# Patient Record
Sex: Female | Born: 1970 | Race: Asian | Hispanic: No | Marital: Married | Smoking: Never smoker
Health system: Southern US, Community
[De-identification: ages and names within clinical notes are randomized; demographics above are authoritative.]

## PROBLEM LIST (undated history)

## (undated) DIAGNOSIS — J45909 Unspecified asthma, uncomplicated: Secondary | ICD-10-CM

## (undated) DIAGNOSIS — E785 Hyperlipidemia, unspecified: Secondary | ICD-10-CM

## (undated) DIAGNOSIS — K219 Gastro-esophageal reflux disease without esophagitis: Secondary | ICD-10-CM

## (undated) HISTORY — DX: Gastro-esophageal reflux disease without esophagitis: K21.9

## (undated) HISTORY — DX: Unspecified asthma, uncomplicated: J45.909

## (undated) HISTORY — DX: Hyperlipidemia, unspecified: E78.5

---

## 2016-08-28 ENCOUNTER — Ambulatory Visit (INDEPENDENT_AMBULATORY_CARE_PROVIDER_SITE_OTHER): Payer: BC Managed Care – PPO | Admitting: Family Medicine

## 2016-08-28 ENCOUNTER — Encounter: Payer: Self-pay | Admitting: Family Medicine

## 2016-08-28 VITALS — BP 114/68 | HR 72 | Temp 98.5°F | Ht 62.0 in | Wt 100.2 lb

## 2016-08-28 DIAGNOSIS — Z0000 Encounter for general adult medical examination without abnormal findings: Secondary | ICD-10-CM

## 2016-08-28 DIAGNOSIS — R079 Chest pain, unspecified: Secondary | ICD-10-CM

## 2016-08-28 NOTE — Progress Notes (Signed)
Pre visit review using our clinic review tool, if applicable. No additional management support is needed unless otherwise documented below in the visit note. 

## 2016-08-28 NOTE — Progress Notes (Signed)
Subjective:    Patient ID: Tonya Higgins, female    DOB: 03/09/1971, 46 y.o.   MRN: [id number]  HPI This is a 46 yo female, accompanied by her husband, who presents today to establish care and discuss chest pain that started today. She woke up at 1:45 in the morning with feeling of chest tightness and pain. Tightness is not constant, pain constant, sometimes feels more with breathing but not with other activities, felt like heart was racing last night. Describes pain as "icky." Currently having pain 6/10. No diaphoresis. Some pain in left shoulder, not sure if this is related to recent yard work. Feels a little short of breath. No family history of heart disease. No previous hospitalizations except for childbirth, no regular medication.   She has two children, 5 and 76 yo. 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 She has had some difficult students lately. Very busy with her job. Husband would like her to exercise more.   Sees gyn (can't remember name). Last pap 03/2016.   No past medical history on file. Past Surgical History:  Procedure Laterality Date  . CESAREAN SECTION     Family History  Problem Relation Age of Onset  . Diabetes Father    Social History  Substance Use Topics  . Smoking status: Never Smoker  . Smokeless tobacco: Never Used  . Alcohol use No     Review of Systems  Constitutional: Positive for fatigue (didn't sleep well last night).  Respiratory: Positive for chest tightness and shortness of breath.   Cardiovascular: Positive for chest pain and palpitations.  Gastrointestinal: Negative for nausea.      Objective:   Physical Exam  Constitutional: She is oriented to person, place, and time. She appears well-developed and well-nourished. No distress.  HENT:  Head: Atraumatic.  Eyes: Conjunctivae are normal.  Neck: Normal range of motion. Neck supple.  Cardiovascular: Normal rate, regular rhythm, normal heart sounds and  intact distal pulses.  Exam reveals no gallop and no friction rub.   No murmur heard. Pulmonary/Chest: Effort normal and breath sounds normal. No respiratory distress. She has no wheezes. She has no rales. She exhibits no tenderness.  Conversing in full symptoms without difficulty, ambulating without difficulty. SpO2 100 %.  Musculoskeletal: Normal range of motion. She exhibits tenderness (left trapezius, left scapula. ). She exhibits no edema.  Neurological: She is alert and oriented to person, place, and time.  Skin: Skin is warm and dry. She is not diaphoretic.  Psychiatric: She has a normal mood and affect. Her behavior is normal. Judgment and thought content normal.  Vitals reviewed.     BP 114/68 (BP Location: Right Arm, Patient Position: Sitting, Cuff Size: Normal)   Pulse 72   Temp 98.5 F (36.9 C) (Oral)   Ht  (1.575 m)   Wt 100 lb 3.2 oz (45.5 kg)   LMP 08/10/2016   SpO2 100%   BMI 18.33 kg/m   EKG- rate 72, negative precordial T waves, otherwise no abnormalities    Assessment & Plan:  Discussed with Dr. Berline Chough who reviewed EKG and agreed with plan  1. Chest pain, unspecified type - does not seem cardiac in nature, discussed EKG and plan with patient and her husband who verbalized understanding - ibuprofen 400 mg po q 8-12 hours - EKG 12-Lead - ER if worsening pain or if occurs on exertion - follow up if no improvement in 48 hours   2. Encounter  for medical examination to establish care - follow up PRN   Olean Ree, FNP-BC  [facility], [facility]  08/28/2016 11:25 AM

## 2016-08-28 NOTE — Patient Instructions (Signed)
Your ekg looks good I think your pain is from your muscles, please try to take some ibuprofen, 2 tablets every 8-12 hours  If your pain gets worse over the weekend, please go to the emergency room If your pain seems to come with exerting yourself, will consider referring you to a cardiologist.

## 2017-03-02 ENCOUNTER — Ambulatory Visit: Payer: BC Managed Care – PPO | Admitting: Family Medicine

## 2017-05-03 ENCOUNTER — Emergency Department (HOSPITAL_COMMUNITY)
Admission: EM | Admit: 2017-05-03 | Discharge: 2017-05-04 | Disposition: A | Discharge status: Home / Self Care | Payer: BC Managed Care – PPO | Attending: Emergency Medicine | Admitting: Emergency Medicine

## 2017-05-03 ENCOUNTER — Encounter (HOSPITAL_COMMUNITY): Payer: Self-pay | Admitting: Emergency Medicine

## 2017-05-03 ENCOUNTER — Other Ambulatory Visit: Payer: Self-pay

## 2017-05-03 DIAGNOSIS — J9801 Acute bronchospasm: Secondary | ICD-10-CM | POA: Diagnosis not present

## 2017-05-03 DIAGNOSIS — R0789 Other chest pain: Secondary | ICD-10-CM

## 2017-05-03 MED ORDER — ALBUTEROL SULFATE (2.5 MG/3ML) 0.083% IN NEBU
5.0000 mg | INHALATION_SOLUTION | Freq: Once | RESPIRATORY_TRACT | Status: DC
  Filled 2017-05-03: qty 6

## 2017-05-03 NOTE — ED Triage Notes (Signed)
Patient is complaining of left chest pain underneath her breast. Patient is also stating that it is hard to breathe. Patient states this started yesterday. 

## 2017-05-04 ENCOUNTER — Emergency Department (HOSPITAL_COMMUNITY): Payer: BC Managed Care – PPO

## 2017-05-04 LAB — COMPREHENSIVE METABOLIC PANEL
ALBUMIN: 4.7 g/dL (ref 3.5–5.0)
ALT: 19 U/L (ref 14–54)
ANION GAP: 5 (ref 5–15)
AST: 18 U/L (ref 15–41)
Alkaline Phosphatase: 65 U/L (ref 38–126)
BILIRUBIN TOTAL: 0.2 mg/dL — AB (ref 0.3–1.2)
BUN: 21 mg/dL — ABNORMAL HIGH (ref 6–20)
CHLORIDE: 104 mmol/L (ref 101–111)
CO2: 26 mmol/L (ref 22–32)
Calcium: 9.5 mg/dL (ref 8.9–10.3)
Creatinine, Ser: 0.82 mg/dL (ref 0.44–1.00)
GFR calc Af Amer: >60 mL/min (ref >60)
GFR calc non Af Amer: >60 mL/min (ref >60)
GLUCOSE: 115 mg/dL — AB (ref 65–99)
POTASSIUM: 3.5 mmol/L (ref 3.5–5.1)
Sodium: 135 mmol/L (ref 135–145)
TOTAL PROTEIN: 7.7 g/dL (ref 6.5–8.1)

## 2017-05-04 LAB — CBC WITH DIFFERENTIAL/PLATELET
BASOS ABS: 0 10*3/uL (ref 0.0–0.1)
Basophils Relative: 0 %
Eosinophils Absolute: 0.1 10*3/uL (ref 0.0–0.7)
Eosinophils Relative: 1 %
HEMATOCRIT: 41.3 % (ref 36.0–46.0)
Hemoglobin: 13.5 g/dL (ref 12.0–15.0)
LYMPHS ABS: 1.9 10*3/uL (ref 0.7–4.0)
LYMPHS PCT: 19 %
MCH: 26.5 pg (ref 26.0–34.0)
MCHC: 32.7 g/dL (ref 30.0–36.0)
MCV: 81 fL (ref 78.0–100.0)
MONO ABS: 0.5 10*3/uL (ref 0.1–1.0)
Monocytes Relative: 5 %
NEUTROS ABS: 7.7 10*3/uL (ref 1.7–7.7)
Neutrophils Relative %: 75 %
Platelets: 310 10*3/uL (ref 150–400)
RBC: 5.1 MIL/uL (ref 3.87–5.11)
RDW: 12.3 % (ref 11.5–15.5)
WBC: 10.2 10*3/uL (ref 4.0–10.5)

## 2017-05-04 LAB — I-STAT TROPONIN, ED: Troponin i, poc: 0 ng/mL (ref 0.00–0.08)

## 2017-05-04 LAB — I-STAT BETA HCG BLOOD, ED (MC, WL, AP ONLY)

## 2017-05-04 MED ORDER — PREDNISONE 20 MG PO TABS
60.0000 mg | ORAL_TABLET | Freq: Once | ORAL | Status: DC
  Filled 2017-05-04: qty 3

## 2017-05-04 MED ORDER — IPRATROPIUM-ALBUTEROL 0.5-2.5 (3) MG/3ML IN SOLN
3.0000 mL | Freq: Once | RESPIRATORY_TRACT | Status: AC
  Administered 2017-05-04: 3 mL via RESPIRATORY_TRACT
  Filled 2017-05-04: qty 3

## 2017-05-04 MED ORDER — ALBUTEROL SULFATE HFA 108 (90 BASE) MCG/ACT IN AERS: 2.0000 | INHALATION_SPRAY | RESPIRATORY_TRACT | 0 refills | Status: DC | PRN

## 2017-05-04 MED ORDER — PREDNISONE 50 MG PO TABS: 50.0000 mg | ORAL_TABLET | Freq: Every day | ORAL | 0 refills | Status: DC

## 2017-05-04 NOTE — Discharge Instructions (Signed)
Use the inhaler as needed for the chest tightness. Return if symptoms are getting worse.

## 2017-05-04 NOTE — ED Provider Notes (Signed)
[facility]-EMERGENCY DEPT Provider Note   CSN: [id number] Arrival date & time: 05/03/17  2330     History   Chief Complaint Chief Complaint  Patient presents with  . Shortness of Breath  . Chest Pain    HPI Tonya Higgins is a 46 y.o. female.  The history is provided by the patient.  She has no significant past medical history.  She had onset yesterday of a heavy feeling in the left lateral chest with some radiation to the left shoulder and upper arm.  This comes and goes.  There is associated dyspnea but no nausea or diaphoresis.  She denies any cough.  Symptoms are worse when she is supine, but also worse when she is outside in cold air.  There is no exertional component.  She rates her discomfort at 3/10.  She did have similar episode 8 months ago.  Of note, her father is in [place] because of a heart attack.  She has no history of diabetes, hypertension, hyperlipidemia.  She is a non-smoker.  There is no family history of premature coronary atherosclerosis.  History reviewed. No pertinent past medical history.  There are no active problems to display for this patient.   Past Surgical History:  Procedure Laterality Date  . CESAREAN SECTION      OB History    No data available       Home Medications    Prior to Admission medications   Not on File    Family History Family History  Problem Relation Age of Onset  . Diabetes Father     Social History Social History   Tobacco Use  . Smoking status: Never Smoker  . Smokeless tobacco: Never Used  Substance Use Topics  . Alcohol use: No  . Drug use: No     Allergies   Patient has no known allergies.   Review of Systems Review of Systems  All other systems reviewed and are negative.    Physical Exam Updated Vital Signs BP 125/83 (BP Location: Left Arm)   Pulse 74   Temp 98.2 F (36.8 C) (Oral)   Resp 18   LMP 04/07/2017   SpO2 99%   Physical Exam    Nursing note and vitals reviewed.  46 year old female, resting comfortably and in no acute distress. Vital signs are normal. Oxygen saturation is 99%, which is normal. Head is normocephalic and atraumatic. PERRLA, EOMI. Oropharynx is clear. Neck is nontender and supple without adenopathy or JVD. Back is nontender and there is no CVA tenderness. Lungs are clear without rales, wheezes, or rhonchi.  Slight prolongation of exhalation phase is noted. Chest is mildly tender diffusely. Heart has regular rate and rhythm without murmur. Abdomen is soft, flat, nontender without masses or hepatosplenomegaly and peristalsis is normoactive. Extremities have no cyanosis or edema, full range of motion is present. Skin is warm and dry without rash. Neurologic: Mental status is normal, cranial nerves are intact, there are no motor or sensory deficits.   ED Treatments / Results  Labs (all labs ordered are listed, but only abnormal results are displayed) Labs Reviewed  COMPREHENSIVE METABOLIC PANEL - Abnormal; Notable for the following components:      Result Value   Glucose, Bld 115 (*)    BUN 21 (*)    Total Bilirubin 0.2 (*)    All other components within normal limits  CBC WITH DIFFERENTIAL/PLATELET  I-STAT TROPONIN, ED  I-STAT BETA HCG BLOOD,  ED [facility], [facility], [facility] ONLY)    EKG  EKG Interpretation  Date/Time:  Monday May 03 2017 23:43:00 EST Ventricular Rate:  72 PR Interval:    QRS Duration: 74 QT Interval:  385 QTC Calculation: 422 R Axis:   82 Text Interpretation:  Sinus rhythm Normal ECG No old tracing to compare Confirmed by Dione BoozeGlick, Tierra Divelbiss ([id number]) on 05/03/2017 11:48:41 PM       Radiology Dg Chest 2 View  Result Date: 05/04/2017 CLINICAL DATA:  Acute onset of left chest pain. Difficulty breathing. EXAM: CHEST  2 VIEW COMPARISON:  None. FINDINGS: The lungs are well-aerated and clear. There is no evidence of focal opacification, pleural effusion or pneumothorax. The heart is normal  in size; the mediastinal contour is within normal limits. No acute osseous abnormalities are seen. IMPRESSION: No acute cardiopulmonary process seen. Electronically Signed   By: Roanna RaiderJeffery  Chang M.D.   On: 05/04/2017 00:28    Procedures Procedures (including critical care time)  Medications Ordered in ED Medications  albuterol (PROVENTIL) (2.5 MG/3ML) 0.083% nebulizer solution 5 mg (not administered)     Initial Impression / Assessment and Plan / ED Course  I have reviewed the triage vital signs and the nursing notes.  Pertinent labs & imaging results that were available during my care of the patient were reviewed by me and considered in my medical decision making (see chart for details).  Chest pain which is somewhat atypical.  She has no cardiac risk factors and ECG is normal.  Heart score is 0, which puts her at very low risk of major adverse cardiac events in the next 30 days.  I suspect that some of her symptoms may be related to concern about her father's illness.  Because of prolonged exhalation phase, she is given a therapeutic trial of albuterol with ipratropium.  Old records are reviewed confirming office evaluation for similar chest discomfort in April of this year.  Laboratory workup and chest x-ray are unremarkable.  She got considerable symptomatic relief with nebulizer treatment.  She is given a dose of prednisone and discharged with prescriptions for prednisone and albuterol inhaler.  Follow-up with PCP as needed.  Return precautions discussed.  Final Clinical Impressions(s) / ED Diagnoses   Final diagnoses:  Atypical chest pain  Bronchospasm    ED Discharge Orders        Ordered    albuterol (PROVENTIL HFA;VENTOLIN HFA) 108 (90 Base) MCG/ACT inhaler  Every 4 hours PRN     05/04/17 0133    predniSONE (DELTASONE) 50 MG tablet  Daily     05/04/17 0133       Dione BoozeGlick, Areyana Leoni, MD 05/04/17 [phone]

## 2017-05-06 ENCOUNTER — Emergency Department (HOSPITAL_COMMUNITY)
Admission: EM | Admit: 2017-05-06 | Discharge: 2017-05-06 | Disposition: A | Discharge status: Home / Self Care | Payer: BC Managed Care – PPO | Attending: Emergency Medicine | Admitting: Emergency Medicine

## 2017-05-06 ENCOUNTER — Ambulatory Visit: Payer: BC Managed Care – PPO | Admitting: Family Medicine

## 2017-05-06 ENCOUNTER — Other Ambulatory Visit: Payer: Self-pay

## 2017-05-06 ENCOUNTER — Ambulatory Visit: Payer: Self-pay

## 2017-05-06 ENCOUNTER — Encounter (HOSPITAL_COMMUNITY): Payer: Self-pay

## 2017-05-06 DIAGNOSIS — R0602 Shortness of breath: Secondary | ICD-10-CM | POA: Insufficient documentation

## 2017-05-06 DIAGNOSIS — R079 Chest pain, unspecified: Secondary | ICD-10-CM | POA: Insufficient documentation

## 2017-05-06 DIAGNOSIS — Z79899 Other long term (current) drug therapy: Secondary | ICD-10-CM | POA: Insufficient documentation

## 2017-05-06 DIAGNOSIS — R200 Anesthesia of skin: Secondary | ICD-10-CM | POA: Diagnosis not present

## 2017-05-06 LAB — COMPREHENSIVE METABOLIC PANEL
ALK PHOS: 58 U/L (ref 38–126)
ALT: 17 U/L (ref 14–54)
ANION GAP: 7 (ref 5–15)
AST: 16 U/L (ref 15–41)
Albumin: 5 g/dL (ref 3.5–5.0)
BILIRUBIN TOTAL: 0.6 mg/dL (ref 0.3–1.2)
BUN: 19 mg/dL (ref 6–20)
CALCIUM: 10 mg/dL (ref 8.9–10.3)
CO2: 28 mmol/L (ref 22–32)
Chloride: 105 mmol/L (ref 101–111)
Creatinine, Ser: 0.8 mg/dL (ref 0.44–1.00)
GLUCOSE: 105 mg/dL — AB (ref 65–99)
Potassium: 4.1 mmol/L (ref 3.5–5.1)
Sodium: 140 mmol/L (ref 135–145)
TOTAL PROTEIN: 8.4 g/dL — AB (ref 6.5–8.1)

## 2017-05-06 LAB — CBC
HEMATOCRIT: 44 % (ref 36.0–46.0)
HEMOGLOBIN: 14.4 g/dL (ref 12.0–15.0)
MCH: 26.8 pg (ref 26.0–34.0)
MCHC: 32.7 g/dL (ref 30.0–36.0)
MCV: 81.8 fL (ref 78.0–100.0)
Platelets: 312 10*3/uL (ref 150–400)
RBC: 5.38 MIL/uL — ABNORMAL HIGH (ref 3.87–5.11)
RDW: 12.4 % (ref 11.5–15.5)
WBC: 7.6 10*3/uL (ref 4.0–10.5)

## 2017-05-06 LAB — TROPONIN I

## 2017-05-06 LAB — I-STAT TROPONIN, ED: TROPONIN I, POC: 0 ng/mL (ref 0.00–0.08)

## 2017-05-06 LAB — D-DIMER, QUANTITATIVE (NOT AT ARMC)

## 2017-05-06 MED ORDER — ALUM & MAG HYDROXIDE-SIMETH 200-200-20 MG/5ML PO SUSP
30.0000 mL | Freq: Once | ORAL | Status: AC
  Administered 2017-05-06: 30 mL via ORAL
  Filled 2017-05-06: qty 30

## 2017-05-06 MED ORDER — OMEPRAZOLE 20 MG PO CPDR: 20.0000 mg | DELAYED_RELEASE_CAPSULE | Freq: Every day | ORAL | 0 refills | Status: DC

## 2017-05-06 NOTE — ED Provider Notes (Signed)
[facility]-EMERGENCY DEPT Provider Note   CSN: [id number] Arrival date & time: 05/06/17  0915     History   Chief Complaint Chief Complaint  Patient presents with  . Chest Pain  . Numbness  . Shortness of Breath    HPI Tonya Higgins is a 46 y.o. female.  HPI Patient is a 46 year old female with no prior cardiac disease who presents the emergency department with chest pain shortness of breath which has been persistent since last night with tightness in her chest and radiation towards her left shoulder.  This is very similar to her episode of discomfort for which she was seen in the emergency department 2 days ago.  At that time she had a workup including chest x-ray, labs, troponin, EKG which demonstrated no evidence of ischemia.  She was discharged home.  She states the only time she had chest discomfort like this prior to the last several days was in the spring 2018.  She was seen by her primary care physician at that time.  She had no recurrent chest pain or arm pain throughout the summer.  She works as a Airline pilotprofessor at [employer]  She is active walking on campus and upstairs but does not routinely exercise.  No prior history of cardiac disease.  No family history of early cardiac disease.  No family history of venous thromboembolic disease.  No history of diabetes, hypertension or hyperlipidemia.  She does not smoke tobacco products.  Denies unilateral leg swelling.  No recent travel or surgery.     History reviewed. No pertinent past medical history.  There are no active problems to display for this patient.   Past Surgical History:  Procedure Laterality Date  . CESAREAN SECTION      OB History    No data available       Home Medications    Prior to Admission medications   Medication Sig Start Date End Date Taking? Authorizing Provider  acetaminophen (TYLENOL) 500 MG tablet Take 500 mg by mouth every 6 (six) hours as needed for mild pain,  moderate pain, fever or headache.   Yes [provider]  albuterol (PROVENTIL HFA;VENTOLIN HFA) 108 (90 Base) MCG/ACT inhaler Inhale 2 puffs into the lungs every 4 (four) hours as needed for wheezing or shortness of breath (or coughing). 05/04/17  Yes Dione BoozeGlick, David, MD  Multiple Vitamins-Minerals (WOMENS ONE DAILY) TABS Take 1 tablet by mouth daily.   Yes [provider]  omeprazole (PRILOSEC) 20 MG capsule Take 1 capsule (20 mg total) by mouth daily. 05/06/17   Azalia Bilisampos, Philippe Gang, MD    Family History Family History  Problem Relation Age of Onset  . Diabetes Father     Social History Social History   Tobacco Use  . Smoking status: Never Smoker  . Smokeless tobacco: Never Used  Substance Use Topics  . Alcohol use: No  . Drug use: No     Allergies   Patient has no known allergies.   Review of Systems Review of Systems  All other systems reviewed and are negative.    Physical Exam Updated Vital Signs BP 104/67 (BP Location: Left Arm)   Pulse 71   Temp 98.4 F (36.9 C) (Oral)   Resp 16   Ht 5\' 2"  (1.575 m)   Wt 45.4 kg (100 lb)   LMP 04/07/2017   SpO2 100%   BMI 18.29 kg/m   Physical Exam  Constitutional: She is oriented to person, place, and time.  She appears well-developed and well-nourished. No distress.  HENT:  Head: Normocephalic and atraumatic.  Eyes: EOM are normal.  Neck: Normal range of motion.  Cardiovascular: Normal rate, regular rhythm and normal heart sounds.  Pulmonary/Chest: Effort normal and breath sounds normal.  Abdominal: Soft. She exhibits no distension. There is no tenderness.  Musculoskeletal: Normal range of motion.  Neurological: She is alert and oriented to person, place, and time.  Skin: Skin is warm and dry.  Psychiatric: She has a normal mood and affect. Judgment normal.  Nursing note and vitals reviewed.    ED Treatments / Results  Labs (all labs ordered are listed, but only abnormal results are displayed) Labs  Reviewed  CBC - Abnormal; Notable for the following components:      Result Value   RBC 5.38 (*)    All other components within normal limits  COMPREHENSIVE METABOLIC PANEL - Abnormal; Notable for the following components:   Glucose, Bld 105 (*)    Total Protein 8.4 (*)    All other components within normal limits  TROPONIN I  D-DIMER, QUANTITATIVE (NOT AT [facility])  I-STAT TROPONIN, ED    EKG  EKG Interpretation  Date/Time:  Thursday May 06 2017 09:34:42 EST Ventricular Rate:  78 PR Interval:    QRS Duration: 81 QT Interval:  369 QTC Calculation: 421 R Axis:   78 Text Interpretation:  Sinus rhythm No significant change was found Confirmed by Azalia Bilisampos, Jaray Boliver ([id number]) on 05/06/2017 10:24:42 AM       Radiology No results found.  Procedures Procedures (including critical care time)  Medications Ordered in ED Medications  alum & mag hydroxide-simeth (MAALOX/MYLANTA) 200-200-20 MG/5ML suspension 30 mL (30 mLs Oral Given 05/06/17 1047)     Initial Impression / Assessment and Plan / ED Course  I have reviewed the triage vital signs and the nursing notes.  Pertinent labs & imaging results that were available during my care of the patient were reviewed by me and considered in my medical decision making (see chart for details).     Chest x-ray from 2 days ago reviewed and without abnormality.  D-dimer negative.  Troponin negative x2.  EKG without changes.  No signs of ischemia.  Low risk as she has no cardiac risk factors.  Feels better after Maalox.  Likely gastroesophageal reflux disease.  I will have her follow-up with cardiology.  She may benefit from outpatient stress testing but my suspicion is low.  I do not think she needs acute hospitalization at this time.  Discharged home in good condition.  Home with Prilosec.  She understands to return to the ER for new or worsening symptoms  Final Clinical Impressions(s) / ED Diagnoses   Final diagnoses:  Chest pain, unspecified  type    ED Discharge Orders        Ordered    omeprazole (PRILOSEC) 20 MG capsule  Daily     05/06/17 1513       Azalia Bilisampos, Sameeha Rockefeller, MD 05/06/17 1534

## 2017-05-06 NOTE — Telephone Encounter (Signed)
See note

## 2017-05-06 NOTE — Telephone Encounter (Signed)
Pt. calls today for follow up. Seen in ED 05/03/17 with chest pain. Diagnosed with atypical chest pain and bronchospasm. States inhaler has helped. Coughing some at night. Is lethargic.  Reason for Disposition . [1] Chest pain(s) lasting a few seconds AND [2] persists > 3 days  Answer Assessment - Initial Assessment Questions 1. LOCATION: "Where does it hurt?"       No pain 2. RADIATION: "Does the pain go anywhere else?" (e.g., into neck, jaw, arms, back)     No 3. ONSET: "When did the chest pain begin?" (Minutes, hours or days)       No pain today 4. PATTERN "Does the pain come and go, or has it been constant since it started?"  "Does it get worse with exertion?"      Comes and and goes. 5. DURATION: "How long does it last" (e.g., seconds, minutes, hours)     N/A 6. SEVERITY: "How bad is the pain?"  (e.g., Scale 1-10; mild, moderate, or severe)    - MILD (1-3): doesn't interfere with normal activities     - MODERATE (4-7): interferes with normal activities or awakens from sleep    - SEVERE (8-10): excruciating pain, unable to do any normal activities       n/A 7. CARDIAC RISK FACTORS: "Do you have any history of heart problems or risk factors for heart disease?" (e.g., prior heart attack, angina; high blood pressure, diabetes, being overweight, high cholesterol, smoking, or strong family history of heart disease)     No 8. PULMONARY RISK FACTORS: "Do you have any history of lung disease?"  (e.g., blood clots in lung, asthma, emphysema, birth control pills)     No 9. CAUSE: "What do you think is causing the chest pain?"     Asthma 10. OTHER SYMPTOMS: "Do you have any other symptoms?" (e.g., dizziness, nausea, vomiting, sweating, fever, difficulty breathing, cough)       No 11. PREGNANCY: "Is there any chance you are pregnant?" "When was your last menstrual period?"       No  Protocols used: CHEST PAIN-A-AH

## 2017-05-06 NOTE — ED Triage Notes (Signed)
Patient states she was seen a few days afor chest pain and SOB, but now the symptoms are worse and still occurring. SOB and chest pain worse when walking

## 2017-05-06 NOTE — Telephone Encounter (Signed)
This patient is currently in the ED

## 2017-05-10 ENCOUNTER — Encounter: Payer: Self-pay | Admitting: Family Medicine

## 2017-05-10 ENCOUNTER — Telehealth: Payer: Self-pay | Admitting: Family Medicine

## 2017-05-10 ENCOUNTER — Ambulatory Visit: Payer: BC Managed Care – PPO | Admitting: Family Medicine

## 2017-05-10 DIAGNOSIS — R0602 Shortness of breath: Secondary | ICD-10-CM | POA: Insufficient documentation

## 2017-05-10 MED ORDER — FLUTICASONE PROPIONATE HFA 44 MCG/ACT IN AERO: 1.0000 | INHALATION_SPRAY | Freq: Two times a day (BID) | RESPIRATORY_TRACT | 12 refills | Status: DC

## 2017-05-10 NOTE — Telephone Encounter (Signed)
Left message to return call to our office.  CRM Started ok to give message.   

## 2017-05-10 NOTE — Assessment & Plan Note (Addendum)
Given that she has some reactive component to her symptoms, as well as improvement with albuterol and Flovent, she very likely has some underlying pulmonary disease.  Today, her respiratory exam is stable and she is satting at 99% on room air.  She underwent cardiac workup recently in the emergency room including EKG and multiple troponins all of which were negative.  Her chest x-ray was negative.  She has no risk factors for cardiac disease.  Her PERC is negative.  She has no signs of heart failure.  We will empirically start albuterol and Flovent for the time being.  We will refer her for PFTs to confirm her diagnosis and rule out other pulmonary etiologies.  Return precautions reviewed.  Patient will follow-up in 1-3 months.

## 2017-05-10 NOTE — Progress Notes (Signed)
    Subjective:  Tonya Higgins is a 46 y.o. female who presents today with a chief complaint of shortness of breath.   HPI: Summary of recent ED visits: Patient presented to the ED on 05/03/2017 with chest pain and shortness of breath.  She had negative cardiac workup at that time however was empirically given albuterol with ipratropium which helped with her symptoms.  She was prescribed prednisone and an albuterol inhaler and discharged home.  Patient again return to the ED on 05/06/2017 with chest pain.  She again had a negative cardiac workup.  She was diagnosed with reflux and started on Prilosec and discharged home.  Shortness of breath, new issue Several year history, however symptoms significantly worsened over the last couple of weeks.  Reports that she was diagnosed with asthma as a child but was never treated for this.  Symptoms are worse with exertion and with exposure to cold air.  Both her children have been diagnosed with asthma.  She has tried to their albuterol and Flovent inhalers which have seemed to help with her symptoms.  No chest pain currently.  No cough or wheeze.  No other notable alleviating factors noted.  ROS: Per HPI, otherwise 10 point review of systems was negative. Pertinent PMSFH: Never smoker.  Both of her children have asthma.  Objective:  Physical Exam: BP 114/64 (BP Location: Left Arm, Patient Position: Sitting, Cuff Size: Normal)   Pulse 89   Temp 97.9 F (36.6 C) (Oral)   Ht 5\' 2"  (1.575 m)   Wt 99 lb 6.4 oz (45.1 kg)   SpO2 99%   BMI 18.18 kg/m   Gen: NAD, resting comfortably CV: RRR with no murmurs appreciated Pulm: NWOB, CTAB with no crackles, wheezes, or rhonchi  Review/summary of ED workup: Chest x-ray 05/04/2017: Negative. EKG 05/07/2017: Normal sinus rhythm.  No ischemic changes.  Assessment/Plan:  No problem-specific Assessment & Plan notes found for this encounter.  Patient has a high level of medical decision making due to  number of diagnoses and amount/complexity of data reviewed.  Katina Degreealeb M. Jimmey RalphParker, MD 05/10/2017 9:33 AM

## 2017-05-10 NOTE — Telephone Encounter (Signed)
Patient wants to follow up with Dr Earlene PlaterWallace after her hospital visit. She has has a cardiac work up and she has been cleared, She is sure that her chest tightness is asthma related and she has used her son's inhaler and it has helped her. She wants to come in to see Dr Earlene PlaterWallace to discuss starting Flovent/Albuterol. She does not want to wait until next week. Can she be worked in sooner? Please let her know.

## 2017-05-10 NOTE — Telephone Encounter (Signed)
See note

## 2017-05-10 NOTE — Telephone Encounter (Signed)
Thanks Dr. Earlene PlaterWallace. No, I clarified that with her; she wants to see you as her family all sees you.

## 2017-05-10 NOTE — Telephone Encounter (Signed)
Okay transfer. Note: She did see Jimmey Ralpharker today - does she want to see him? EW

## 2017-05-10 NOTE — Telephone Encounter (Signed)
The patient would like to transfer care from Deboraha Sprangebbie Gessner to Dr. Earlene PlaterWallace. Please advise. The patient would like to be notified via phone call when a decision has been made.

## 2017-05-11 DIAGNOSIS — Z87898 Personal history of other specified conditions: Secondary | ICD-10-CM

## 2017-05-11 HISTORY — DX: Personal history of other specified conditions: Z87.898

## 2017-05-12 NOTE — Telephone Encounter (Signed)
Was seen in office on 12/31

## 2017-05-14 ENCOUNTER — Telehealth: Payer: Self-pay | Admitting: Family Medicine

## 2017-05-14 NOTE — Telephone Encounter (Signed)
Thanks for following up. Not acceptable to be transferred this way. Should get an okay from previous PCP. In this case, since I agreed to take her and because I am seeing her other family members. I am including Tonya Higgins and Lea. Please find out what happened and follow up with me re: this issue.

## 2017-05-14 NOTE — Telephone Encounter (Signed)
Dear Dr. Earlene PlaterWallace, earlier this week I sent a telephone note to you and Deboraha SprangDebbie Gessner because the patient [facility]. I see your answer, however I just learned that she's on vacation. I went to call the patient to update them, however the patient has a new patient appointment at a different [place] office. Would you like me to leave it be, or reach out to her? Please advise.

## 2017-05-14 NOTE — Telephone Encounter (Signed)
I was notified by the [facility] during a previous call this week with a separate question that Eunice BlaseDebbie was on vacation and remembered the verbal encounter when this was brought to my attention this morning. Unfortunately, Eunice BlaseDebbie was unable to respond in time before the patient chose another location. No further action needed by our office due to patient already transferring to another office.

## 2017-05-14 NOTE — Telephone Encounter (Signed)
I spoke with the patient and she was upset because she had requested Dr. Earlene PlaterWallace to be her PCP from the beginning. I have scheduled the patient to see Dr. Earlene PlaterWallace on 05/19/17 at 3 PM and made Dr. Earlene PlaterWallace her PCP in [place].

## 2017-05-14 NOTE — Telephone Encounter (Signed)
Please call the patient to see if she would like to transfer or [facility]

## 2017-05-15 NOTE — Telephone Encounter (Signed)
FYI

## 2017-05-17 ENCOUNTER — Encounter: Payer: Self-pay | Admitting: Family Medicine

## 2017-05-17 ENCOUNTER — Ambulatory Visit (INDEPENDENT_AMBULATORY_CARE_PROVIDER_SITE_OTHER): Payer: BC Managed Care – PPO | Admitting: Family Medicine

## 2017-05-17 VITALS — BP 110/64 | HR 79 | Temp 97.6°F | Wt 99.8 lb

## 2017-05-17 DIAGNOSIS — N644 Mastodynia: Secondary | ICD-10-CM | POA: Diagnosis not present

## 2017-05-17 DIAGNOSIS — N912 Amenorrhea, unspecified: Secondary | ICD-10-CM | POA: Diagnosis not present

## 2017-05-17 DIAGNOSIS — R079 Chest pain, unspecified: Secondary | ICD-10-CM

## 2017-05-17 DIAGNOSIS — R0789 Other chest pain: Secondary | ICD-10-CM

## 2017-05-17 LAB — COMPREHENSIVE METABOLIC PANEL
ALT: 26 U/L (ref 0–35)
AST: 18 U/L (ref 0–37)
Albumin: 4.8 g/dL (ref 3.5–5.2)
Alkaline Phosphatase: 56 U/L (ref 39–117)
BUN: 12 mg/dL (ref 6–23)
CO2: 29 mEq/L (ref 19–32)
Calcium: 10.1 mg/dL (ref 8.4–10.5)
Chloride: 103 mEq/L (ref 96–112)
Creatinine, Ser: 0.71 mg/dL (ref 0.40–1.20)
GFR: 93.96 mL/min (ref >60.00)
Glucose, Bld: 104 mg/dL — ABNORMAL HIGH (ref 70–99)
Potassium: 4.7 mEq/L (ref 3.5–5.1)
Sodium: 139 mEq/L (ref 135–145)
Total Bilirubin: 0.5 mg/dL (ref 0.2–1.2)
Total Protein: 7.2 g/dL (ref 6.0–8.3)

## 2017-05-17 LAB — CBC WITH DIFFERENTIAL/PLATELET
Basophils Absolute: 0.1 10*3/uL (ref 0.0–0.1)
Basophils Relative: 0.5 % (ref 0.0–3.0)
Eosinophils Absolute: 0.1 10*3/uL (ref 0.0–0.7)
Eosinophils Relative: 0.7 % (ref 0.0–5.0)
HCT: 42.8 % (ref 36.0–46.0)
Hemoglobin: 13.7 g/dL (ref 12.0–15.0)
Lymphocytes Relative: 22.9 % (ref 12.0–46.0)
Lymphs Abs: 2.3 10*3/uL (ref 0.7–4.0)
MCHC: 32 g/dL (ref 30.0–36.0)
MCV: 82.1 fl (ref 78.0–100.0)
Monocytes Absolute: 0.6 10*3/uL (ref 0.1–1.0)
Monocytes Relative: 6.4 % (ref 3.0–12.0)
Neutro Abs: 7 10*3/uL (ref 1.4–7.7)
Neutrophils Relative %: 69.5 % (ref 43.0–77.0)
Platelets: 302 10*3/uL (ref 150.0–400.0)
RBC: 5.21 Mil/uL — ABNORMAL HIGH (ref 3.87–5.11)
RDW: 12.6 % (ref 11.5–15.5)
WBC: 10.1 10*3/uL (ref 4.0–10.5)

## 2017-05-17 LAB — LUTEINIZING HORMONE: LH: 24.69 m[IU]/mL

## 2017-05-17 LAB — POCT URINE PREGNANCY: Preg Test, Ur: NEGATIVE

## 2017-05-17 LAB — C-REACTIVE PROTEIN: CRP: 0.1 mg/dL — ABNORMAL LOW (ref 0.5–20.0)

## 2017-05-17 LAB — FOLLICLE STIMULATING HORMONE: FSH: 16.1 m[IU]/mL

## 2017-05-17 LAB — SEDIMENTATION RATE: Sed Rate: 6 mm/hr (ref 0–20)

## 2017-05-17 LAB — H. PYLORI ANTIBODY, IGG: H Pylori IgG: NEGATIVE

## 2017-05-17 MED ORDER — BUDESONIDE-FORMOTEROL FUMARATE 160-4.5 MCG/ACT IN AERO: 2.0000 | INHALATION_SPRAY | Freq: Two times a day (BID) | RESPIRATORY_TRACT | 3 refills | Status: DC

## 2017-05-17 MED ORDER — MONTELUKAST SODIUM 10 MG PO TABS: 10.0000 mg | ORAL_TABLET | Freq: Every day | ORAL | 3 refills | Status: DC

## 2017-05-17 MED ORDER — IBUPROFEN-FAMOTIDINE 800-26.6 MG PO TABS: 1.0000 | ORAL_TABLET | Freq: Two times a day (BID) | ORAL | 0 refills | Status: DC

## 2017-05-17 NOTE — Progress Notes (Signed)
Tonya Higgins is a 47 y.o. female [facility]   Patient Care Team: Helane RimaWallace, Gae Bihl, DO as PCP - General (Family Medicine)   History of Present Illness:  Tonya Higgins, CMA acting as scribe for Dr. Helane RimaErica Baylen Dea.   Chest Pain   This is a new problem. The current episode started 1 to 4 weeks ago. The onset quality is gradual. The problem occurs daily. The problem has been unchanged. The pain is at a severity of 4/10. The quality of the pain is described as crushing and heavy. The pain radiates to the right shoulder. Associated symptoms include dizziness, nausea and shortness of breath. Pertinent negatives include no fever. She has tried antacids for the symptoms. The treatment provided mild relief.   The patient is a professor at [employer], works in lab. Endorses high-stress, but states that it is not new.   Health Maintenance Due  Topic Date Due  . HIV Screening  10/25/1985  . TETANUS/TDAP  10/25/1989  . PAP SMEAR  10/26/1991   Depression screen PHQ 2/9 05/17/2017  Decreased Interest 0  Down, Depressed, Hopeless 0  PHQ - 2 Score 0   PMHx, SurgHx, SocialHx, Medications, and Allergies were reviewed in the Visit Navigator and updated as appropriate.   History reviewed. No pertinent past medical history.  Past Surgical History:  Procedure Laterality Date  . CESAREAN SECTION      Family History  Problem Relation Age of Onset  . Diabetes Father   . Asthma Child   . Breast cancer Neg Hx    Social History   Tobacco Use  . Smoking status: Never Smoker  . Smokeless tobacco: Never Used  Substance Use Topics  . Alcohol use: No  . Drug use: No   Current Medications and Allergies:   .  albuterol (PROVENTIL HFA;VENTOLIN HFA) 108 (90 Base) MCG/ACT inhaler, Inhale 2 puffs into the lungs every 4 (four) hours as needed for wheezing or shortness of breath (or coughing)., Disp: 1 Inhaler, Rfl: 0 .  fexofenadine (ALLEGRA) 30 MG/5ML suspension, Take 30 mg by mouth daily.,  Disp: , Rfl:  .  fluticasone (FLOVENT HFA) 44 MCG/ACT inhaler, Inhale 1 puff into the lungs 2 (two) times daily., Disp: 1 Inhaler, Rfl: 12 .  Multiple Vitamins-Minerals (WOMENS ONE DAILY) TABS, Take 1 tablet by mouth daily., Disp: , Rfl:   No Known Allergies   Review of Systems:   Pertinent items are noted in the HPI. Otherwise, ROS is negative.  Vitals:   Vitals:   05/17/17 1241  BP: 110/64  Pulse: 79  Temp: 97.6 F (36.4 C)  TempSrc: Oral  SpO2: 96%  Weight: 99 lb 12.8 oz (45.3 kg)     Body mass index is 18.25 kg/m.  Physical Exam:   Physical Exam  Constitutional: She is oriented to person, place, and time. She appears well-developed and well-nourished. No distress.  HENT:  Head: Normocephalic and atraumatic.  Right Ear: External ear normal.  Left Ear: External ear normal.  Nose: Nose normal.  Mouth/Throat: Oropharynx is clear and moist.  Eyes: Conjunctivae and EOM are normal. Pupils are equal, round, and reactive to light.  Neck: Normal range of motion. Neck supple. No thyromegaly present.  Cardiovascular: Normal rate, regular rhythm, normal heart sounds and intact distal pulses.  Pulmonary/Chest: Effort normal and breath sounds normal.  Abdominal: Soft. Bowel sounds are normal.  Musculoskeletal: Normal range of motion.  Lymphadenopathy:    She has no cervical adenopathy.  Neurological: She is  alert and oriented to person, place, and time.  Skin: Skin is warm and dry. Capillary refill takes less than 2 seconds.  Psychiatric: She has a normal mood and affect. Her behavior is normal.  Nursing note and vitals reviewed.  Results for orders placed or performed in visit on 05/17/17  H. pylori antibody, IgG  Result Value Ref Range   H Pylori IgG Negative Negative  CBC with Differential/Platelet  Result Value Ref Range   WBC 10.1 4.0 - 10.5 K/uL   RBC 5.21 (H) 3.87 - 5.11 Mil/uL   Hemoglobin 13.7 12.0 - 15.0 g/dL   HCT 16.1 09.6 - 04.5 %   MCV 82.1 78.0 - 100.0 fl    MCHC 32.0 30.0 - 36.0 g/dL   RDW 40.9 81.1 - 91.4 %   Platelets 302.0 150.0 - 400.0 K/uL   Neutrophils Relative % 69.5 43.0 - 77.0 %   Lymphocytes Relative 22.9 12.0 - 46.0 %   Monocytes Relative 6.4 3.0 - 12.0 %   Eosinophils Relative 0.7 0.0 - 5.0 %   Basophils Relative 0.5 0.0 - 3.0 %   Neutro Abs 7.0 1.4 - 7.7 K/uL   Lymphs Abs 2.3 0.7 - 4.0 K/uL   Monocytes Absolute 0.6 0.1 - 1.0 K/uL   Eosinophils Absolute 0.1 0.0 - 0.7 K/uL   Basophils Absolute 0.1 0.0 - 0.1 K/uL  Comprehensive metabolic panel  Result Value Ref Range   Sodium 139 135 - 145 mEq/L   Potassium 4.7 3.5 - 5.1 mEq/L   Chloride 103 96 - 112 mEq/L   CO2 29 19 - 32 mEq/L   Glucose, Bld 104 (H) 70 - 99 mg/dL   BUN 12 6 - 23 mg/dL   Creatinine, Ser 7.82 0.40 - 1.20 mg/dL   Total Bilirubin 0.5 0.2 - 1.2 mg/dL   Alkaline Phosphatase 56 39 - 117 U/L   AST 18 0 - 37 U/L   ALT 26 0 - 35 U/L   Total Protein 7.2 6.0 - 8.3 g/dL   Albumin 4.8 3.5 - 5.2 g/dL   Calcium 95.6 8.4 - 21.3 mg/dL   GFR 08.65 >78.46 mL/min  C-reactive protein  Result Value Ref Range   CRP 0.1 (L) 0.5 - 20.0 mg/dL  Sedimentation rate  Result Value Ref Range   Sed Rate 6 0 - 20 mm/hr  Follicle stimulating hormone  Result Value Ref Range   FSH 16.1 mIU/ML  Luteinizing hormone  Result Value Ref Range   LH 24.69 mIU/mL  POCT urine pregnancy  Result Value Ref Range   Preg Test, Ur Negative Negative   EKG: unchanged from previous tracings, normal sinus rhythm.  Assessment and Plan:   1. Atypical chest pain No red flags today. EKG WNL, previous CXR WNL. She has already been referred to Pulmonology for PFTs. She has also started using albuterol prn with mild relief of symptoms. Famotidine recommended at last visit with mild improvement in symptoms. She does not have tenderness along the costochondral junction, but reports sleeping in a fetal position. The family just got a new puppy and she is worried that the new exposure may be causing an  allergic reaction, resulting in a RAD. Labs, CXR, EKG, and exam are all reassuring. Will add Singulair to current allergy regimen. Will give sample of Symbicort to see if inhaled corticosteroid is helpful. Red flags reviewed.   - EKG 12-Lead - montelukast (SINGULAIR) 10 MG tablet; Take 1 tablet (10 mg total) by mouth at bedtime.  Dispense:  30 tablet; Refill: 3 - H. pylori antibody, IgG - ECHOCARDIOGRAM COMPLETE; Future - CBC with Differential/Platelet - Comprehensive metabolic panel - C-reactive protein - Sedimentation rate  2. Amenorrhea - POCT urine pregnancy - Follicle stimulating hormone - Luteinizing hormone  3. Breast pain Mild tenderness to palpation at left breast, 3 o'clock, near midaxillary line. No skin changes or palpable nodules. She is due for a mammogram, so will go ahead and order.   - MM DIAG BREAST TOMO BILATERAL; Future   . Reviewed expectations re: course of current medical issues. . Discussed self-management of symptoms. . Outlined signs and symptoms indicating need for more acute intervention. . Patient verbalized understanding and all questions were answered. Marland Kitchen Health Maintenance issues including appropriate healthy diet, exercise, and smoking avoidance were discussed with patient. . See orders for this visit as documented in the electronic medical record. . Patient received an After Visit Summary.  CMA served as Neurosurgeon during this visit. History, Physical, and Plan performed by medical provider. The above documentation has been reviewed and is accurate and complete. Helane Rima, D.O.   Helane Rima, DO [place] 05/22/2017

## 2017-05-19 ENCOUNTER — Ambulatory Visit: Payer: BC Managed Care – PPO | Admitting: Family Medicine

## 2017-05-20 ENCOUNTER — Ambulatory Visit: Payer: BC Managed Care – PPO | Admitting: Internal Medicine

## 2017-05-20 ENCOUNTER — Other Ambulatory Visit (INDEPENDENT_AMBULATORY_CARE_PROVIDER_SITE_OTHER): Payer: BC Managed Care – PPO

## 2017-05-20 ENCOUNTER — Encounter: Payer: Self-pay | Admitting: Internal Medicine

## 2017-05-20 ENCOUNTER — Other Ambulatory Visit: Payer: Self-pay

## 2017-05-20 ENCOUNTER — Ambulatory Visit (HOSPITAL_COMMUNITY): Payer: BC Managed Care – PPO | Attending: Cardiology

## 2017-05-20 VITALS — BP 104/60 | HR 77 | Ht 62.0 in | Wt 100.0 lb

## 2017-05-20 DIAGNOSIS — R42 Dizziness and giddiness: Secondary | ICD-10-CM | POA: Diagnosis not present

## 2017-05-20 DIAGNOSIS — J4520 Mild intermittent asthma, uncomplicated: Secondary | ICD-10-CM

## 2017-05-20 DIAGNOSIS — R079 Chest pain, unspecified: Secondary | ICD-10-CM | POA: Diagnosis not present

## 2017-05-20 DIAGNOSIS — R110 Nausea: Secondary | ICD-10-CM | POA: Insufficient documentation

## 2017-05-20 LAB — CBC WITH DIFFERENTIAL/PLATELET
Basophils Absolute: 0.1 10*3/uL (ref 0.0–0.1)
Basophils Relative: 1.1 % (ref 0.0–3.0)
Eosinophils Absolute: 0.2 10*3/uL (ref 0.0–0.7)
Eosinophils Relative: 1.3 % (ref 0.0–5.0)
HCT: 39.4 % (ref 36.0–46.0)
HEMOGLOBIN: 12.6 g/dL (ref 12.0–15.0)
LYMPHS PCT: 20.8 % (ref 12.0–46.0)
Lymphs Abs: 2.3 10*3/uL (ref 0.7–4.0)
MCHC: 31.9 g/dL (ref 30.0–36.0)
MCV: 81.4 fl (ref 78.0–100.0)
MONO ABS: 0.7 10*3/uL (ref 0.1–1.0)
MONOS PCT: 6.1 % (ref 3.0–12.0)
Neutro Abs: 7.9 10*3/uL — ABNORMAL HIGH (ref 1.4–7.7)
Neutrophils Relative %: 70.7 % (ref 43.0–77.0)
Platelets: 288 10*3/uL (ref 150.0–400.0)
RBC: 4.84 Mil/uL (ref 3.87–5.11)
RDW: 12.9 % (ref 11.5–15.5)
WBC: 11.1 10*3/uL — AB (ref 4.0–10.5)

## 2017-05-20 MED ORDER — BUDESONIDE-FORMOTEROL FUMARATE 80-4.5 MCG/ACT IN AERO: 2.0000 | INHALATION_SPRAY | Freq: Two times a day (BID) | RESPIRATORY_TRACT | 11 refills | Status: DC

## 2017-05-20 MED ORDER — PANTOPRAZOLE SODIUM 40 MG PO TBEC
40.0000 mg | DELAYED_RELEASE_TABLET | Freq: Every day | ORAL | 2 refills | Status: DC
Start: 2017-05-20 — End: 2017-06-08

## 2017-05-20 MED ORDER — FAMOTIDINE 20 MG PO TABS: ORAL_TABLET | ORAL | 2 refills | Status: DC

## 2017-05-20 NOTE — Patient Instructions (Addendum)
Plan A = Automatic = symbicort 80 Take 2 puffs first thing in am and then another 2 puffs about 12 hours later and continue singulair 10 mg each am  And Pantoprazole (protonix) 40 mg   Take  30-60 min before first meal of the day and Pepcid (famotidine)  20 mg one @  bedtime until return to office - this is the best way to tell whether stomach acid is contributing to your problem.    Plan B = Backup Only use your albuterol as a rescue medication to be used if you can't catch your breath by resting or doing a relaxed purse lip breathing pattern.  - The less you use it, the better it will work when you need it. - Ok to use the inhaler up to 2 puffs  every 4 hours if you must but call for appointment if use goes up over your usual need - Don't leave home without it !!  (think of it like the spare tire for your car)    GERD (REFLUX)  is an extremely common cause of respiratory symptoms just like yours , many times with no obvious heartburn at all.    It can be treated with medication, but also with lifestyle changes including elevation of the head of your bed (ideally with 6 inch  bed blocks),  Smoking cessation, avoidance of late meals, excessive alcohol, and avoid fatty foods, chocolate, peppermint, colas, red wine, and acidic juices such as orange juice.  NO MINT OR MENTHOL PRODUCTS SO NO COUGH DROPS   USE SUGARLESS CANDY INSTEAD (Jolley ranchers or Stover's or Life Savers) or even ice chips will also do - the key is to swallow to prevent all throat clearing. NO OIL BASED VITAMINS - use powdered substitutes.    Please remember to go to the lab department downstairs in the basement  for your tests - we will call you with the results when they are available.  Please schedule a follow up office visit in 4 weeks, sooner if needed with pfts on return and all inhalers / meds in hand to sort out what you need long term to control this problem which is likely asthma

## 2017-05-20 NOTE — Addendum Note (Signed)
Addended by: Christen ButterASKIN, Arrow Tomko M on: 05/20/2017 03:29 PM   Modules accepted: Orders

## 2017-05-20 NOTE — Progress Notes (Signed)
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 Pulmonary Consult    Referred by Dr. Jimmey Ralph. Pt c/o SOB since 05/03/17. She states she feels SOB in the early am and then at night. She also has had some chest tightness.    new onset chest tightness April 2018 p working in the yard woke up wit it next day  And resolved s rx  New puppy afternoon of Dec 23'd = chizu then night of Dec 24th late pm chest tightness / sob s cough > er inhalers and pred and 40% better > back to ER 12/27 with neg enzymes/ neg d dimer > prilosec x 7 days no change then  Found son's inhaler worked better than anything= albuterol / flovent and started on singulair 05/17/17 and symbicort 160 and 100% better x for occ hb.   No obvious day to day or daytime variability or assoc excess/ purulent sputum or mucus plugs or hemoptysis or    subjective wheeze or overt sinus  symptoms. No unusual exposure hx (has no international travel in 10 years) or h/o childhood pna/ asthma or knowledge of premature birth.  Sleeping ok flat without nocturnal  or early am exacerbation  of respiratory  c/o's or need for noct saba. Also denies any obvious fluctuation of symptoms with weather or environmental changes or other aggravating or alleviating factors except as outlined above   Current Allergies, Complete Past Medical History, Past Surgical History, Family History, and Social History were reviewed in [employer] record.  ROS  The following are not active complaints unless bolded Hoarseness, sore throat, dysphagia, dental problems, itching, sneezing,  nasal congestion or discharge of excess mucus or  purulent secretions, ear ache,   fever, chills, sweats, unintended wt loss or wt gain, classically pleuritic or exertional cp,  orthopnea pnd or leg swelling, presyncope, palpitations, abdominal pain, anorexia, nausea, vomiting, diarrhea  or change in bowel habits or change in bladder habits, change in stools or change in urine, dysuria, hematuria,  rash, arthralgias, visual complaints, headache, numbness, weakness or ataxia or problems with walking or coordination,  change in mood/affect or memory.        Current Meds  Medication Sig  . albuterol (PROVENTIL HFA;VENTOLIN HFA) 108 (90 Base) MCG/ACT inhaler Inhale 2 puffs into the lungs every 4 (four) hours as needed for wheezing or shortness of breath (or coughing).  . montelukast (SINGULAIR) 10 MG tablet Take 1 tablet (10 mg total) by mouth at bedtime.  . Multiple Vitamins-Minerals (WOMENS ONE DAILY) TABS Take 1 tablet by mouth daily.  . [DISCONTINUED] budesonide-formoterol (SYMBICORT) 160-4.5 MCG/ACT inhaler Inhale 2 puffs into the lungs 2 (two) times daily.  .  S              Review of Systems     Objective:   Physical Exam     Amb [place] female nad  Wt Readings from Last 3 Encounters:  05/20/17 100 lb (45.4 kg)  05/17/17 99 lb 12.8 oz (45.3 kg)  05/10/17 99 lb 6.4 oz (45.1 kg)     Vital signs reviewed - Note on arrival  02 sats  100% on RA      HEENT: nl dentition, turbinates bilaterally, and oropharynx. Nl external ear canals without cough reflex   NECK :  without JVD/Nodes/TM/ nl carotid upstrokes bilaterally   LUNGS: no acc muscle use,  Nl contour chest which is clear to A and P bilaterally without cough on insp or exp maneuvers   CV:  RRR  no s3 or murmur or increase in P2, and no edema   ABD:  soft and nontender with nl inspiratory excursion in the supine position. No bruits or organomegaly appreciated, bowel sounds nl  MS:  Nl gait/ ext warm without deformities, calf tenderness, cyanosis or clubbing No  obvious joint restrictions   SKIN: warm and dry without lesions    NEURO:  alert, approp, nl sensorium with  no motor or cerebellar deficits apparent.       I personally reviewed images and agree with radiology impression as follows:  CXR:   05/04/17 No acute cardiopulmonary process seen.  Assessment:

## 2017-05-20 NOTE — Addendum Note (Signed)
Addended by: Christen ButterASKIN, Tracey Hermance M on: 05/20/2017 03:13 PM   Modules accepted: Orders

## 2017-05-20 NOTE — Assessment & Plan Note (Signed)
Spirometry 05/20/2017  Not physiologic  - Allergy profile 05/20/2017 >  Eos 0. /  IgE   - 05/20/2017  After extensive coaching inhaler device  effectiveness =    50% try symb 80 2bid    Symptoms are markedly disproportionate to objective findings and not clear this is actually much of a  lung problem but pt does appear to have difficult to sort out respiratory symptoms of unknown origin for which  DDX  = almost all start with A and  include Adherence, Ace Inhibitors, Acid Reflux, Active Sinus Disease, Alpha 1 Antitripsin deficiency, Anxiety masquerading as Airways dz,  ABPA,  Allergy(esp in young), Aspiration (esp in elderly), Adverse effects of meds,  Active smokers, A bunch of PE's (a small clot burden can't cause this syndrome unless there is already severe underlying pulm or vascular dz with poor reserve) plus two Bs  = Bronchiectasis and Beta blocker use..and one C= CHF     Adherence is always the initial "prime suspect" and is a multilayered concern that requires a "trust but verify" approach in every patient - starting with knowing how to use medications, especially inhalers, correctly, keeping up with refills and understanding the fundamental difference between maintenance and prns vs those medications only taken for a very short course and then stopped and not refilled.  - see hfa teaching - such poor hfa baseline makes me wonder how much asthma she has  - return with all meds in hand using a trust but verify approach to confirm accurate Medication  Reconciliation The principal here is that until we are certain that the  patients are doing what we've asked, it makes no sense to ask them to do more.    ? Allergy >  Send profile, continue singulair for now but use lower dose of symbicort if possible   .? Acid (or non-acid) GERD > always difficult to exclude as up to 75% of pts in some series report no assoc GI/ Heartburn symptoms> rec max (24h)  acid suppression and diet restrictions/ reviewed and  instructions given in writing.   ? A bunch of pe's > D dimer nl - while  A nl valute  may miss small peripheral pe, the clot burden with sob is moderately high and the d dimer has a very high neg pred value in this setting    ? Anxiety > usually at the bottom of this list of usual suspects but should be much higher on this pt's based on H and P  .    F/u in 4 weeks with pfts   Total time devoted to counseling  > 50 % of initial 60 min office visit:  review case with pt/ discussion of options/alternatives/ personally creating written customized instructions  in presence of pt  then going over those specific  Instructions directly with the pt including how to use all of the meds but in particular covering each new medication in detail and the difference between the maintenance= "automatic" meds and the prns using an action plan format for the latter (If this problem/symptom => do that organization reading Left to right).  Please see AVS from this visit for a full list of these instructions which I personally wrote for this pt and  are unique to this visit.

## 2017-05-21 ENCOUNTER — Ambulatory Visit
Admission: RE | Admit: 2017-05-21 | Discharge: 2017-05-21 | Disposition: A | Discharge status: Home / Self Care | Payer: BC Managed Care – PPO | Source: Ambulatory Visit | Attending: Family Medicine | Admitting: Family Medicine

## 2017-05-21 ENCOUNTER — Ambulatory Visit: Payer: BC Managed Care – PPO

## 2017-05-21 DIAGNOSIS — N644 Mastodynia: Secondary | ICD-10-CM

## 2017-05-21 LAB — RESPIRATORY ALLERGY PROFILE REGION II ~~LOC~~
ALLERGEN, COMM SILVER BIRCH, T3: 1.52 kU/L — AB
ALLERGEN, OAK, T7: 0.19 kU/L — AB
Allergen, Cottonwood, t14: 0.13 kU/L — ABNORMAL HIGH
Allergen, Mulberry, t76: <0.1 kU/L
Allergen, P. notatum, m1: <0.1 kU/L
Aspergillus fumigatus, m3: <0.1 kU/L
Bermuda Grass: <0.1 kU/L
Box Elder IgE: 0.1 kU/L — ABNORMAL HIGH
CLADOSPORIUM HERBARUM (M2) IGE: <0.1 kU/L
CLASS: 0
CLASS: 0
CLASS: 0
CLASS: 0
CLASS: 0
CLASS: 0
CLASS: 0
COMMON RAGWEED (SHORT) (W1) IGE: <0.1 kU/L
Class: 0
Class: 0
Class: 0
Class: 0
Class: 0
Class: 0
Class: 0
Class: 0
Class: 0
Class: 0
Class: 0
Class: 0
Class: 0
Class: 0
Class: 0
Class: 2
Class: 3
Dog Dander: <0.1 kU/L
Elm IgE: 0.12 kU/L — ABNORMAL HIGH
IGE (IMMUNOGLOBULIN E), SERUM: 72 kU/L (ref <=114)
Johnson Grass: <0.1 kU/L
Pecan/Hickory Tree IgE: 7.6 kU/L — ABNORMAL HIGH
Rough Pigweed  IgE: <0.1 kU/L
Sheep Sorrel IgE: <0.1 kU/L

## 2017-05-21 LAB — INTERPRETATION:

## 2017-05-24 NOTE — Progress Notes (Signed)
Spoke with pt and notified of results per Dr. Wert. Pt verbalized understanding and denied any questions. 

## 2017-06-02 ENCOUNTER — Ambulatory Visit: Payer: BC Managed Care – PPO | Admitting: Family Medicine

## 2017-06-08 ENCOUNTER — Encounter: Payer: Self-pay | Admitting: Family Medicine

## 2017-06-08 ENCOUNTER — Ambulatory Visit: Payer: BC Managed Care – PPO | Admitting: Family Medicine

## 2017-06-08 VITALS — BP 104/68 | HR 88 | Temp 98.0°F | Ht 62.0 in | Wt 99.8 lb

## 2017-06-08 DIAGNOSIS — R0789 Other chest pain: Secondary | ICD-10-CM

## 2017-06-10 ENCOUNTER — Encounter: Payer: Self-pay | Admitting: Family Medicine

## 2017-06-10 NOTE — Progress Notes (Signed)
   Tonya Higgins is a 47 y.o. female is here for follow up.  History of Present Illness:   HPI: Cough and CP have improved. She believes that there was definitely a GERD component to it. She did think that the inhalers helped as well. Pulmonology and Cardiology notes reviewed.   She has significant stress from work. Interested in counseling.   Health Maintenance Due  Topic Date Due  . HIV Screening  10/25/1985  . TETANUS/TDAP  10/25/1989  . PAP SMEAR  10/26/1991   Depression screen PHQ 2/9 05/17/2017  Decreased Interest 0  Down, Depressed, Hopeless 0  PHQ - 2 Score 0   PMHx, SurgHx, SocialHx, FamHx, Medications, and Allergies were reviewed in the Visit Navigator and updated as appropriate.   Patient Active Problem List   Diagnosis Date Noted  . Mild intermittent asthma 05/20/2017  . Shortness of breath 05/10/2017   Social History   Tobacco Use  . Smoking status: Never Smoker  . Smokeless tobacco: Never Used  Substance Use Topics  . Alcohol use: No  . Drug use: No   Current Medications and Allergies:   Current Outpatient Medications:  .  albuterol (PROVENTIL HFA;VENTOLIN HFA) 108 (90 Base) MCG/ACT inhaler, Inhale 2 puffs into the lungs every 4 (four) hours as needed for wheezing or shortness of breath (or coughing)., Disp: 1 Inhaler, Rfl: 0 .  Multiple Vitamins-Minerals (WOMENS ONE DAILY) TABS, Take 1 tablet by mouth daily., Disp: , Rfl:   No Known Allergies   Review of Systems   Pertinent items are noted in the HPI. Otherwise, ROS is negative.  Vitals:   Vitals:   06/08/17 1406  BP: 104/68  Pulse: 88  Temp: 98 F (36.7 C)  TempSrc: Oral  SpO2: 98%  Weight: 99 lb 12.8 oz (45.3 kg)  Height: 5\' 2"  (1.575 m)     Body mass index is 18.25 kg/m.   Physical Exam:   Physical Exam  Constitutional: She appears well-nourished.  HENT:  Head: Normocephalic and atraumatic.  Eyes: EOM are normal. Pupils are equal, round, and reactive to light.  Neck: Normal  range of motion. Neck supple.  Cardiovascular: Normal rate, regular rhythm, normal heart sounds and intact distal pulses.  Pulmonary/Chest: Effort normal.  Abdominal: Soft.  Skin: Skin is warm.  Psychiatric: She has a normal mood and affect. Her behavior is normal.  Nursing note and vitals reviewed.    Assessment and Plan:   Diagnoses and all orders for this visit:  Atypical chest pain Comments: Resolved. She understands fture treatment options. Situational anxiety due to hostil work environment. Therapy offered.   . Reviewed expectations re: course of current medical issues. . Discussed self-management of symptoms. . Outlined signs and symptoms indicating need for more acute intervention. . Patient verbalized understanding and all questions were answered. Marland Kitchen. Health Maintenance issues including appropriate healthy diet, exercise, and smoking avoidance were discussed with patient. . See orders for this visit as documented in the electronic medical record. . Patient received an After Visit Summary.  Helane RimaErica Forrester Blando, DO [place] 06/10/2017  Future Appointments  Date Time Provider Department Center  06/21/2017  3:00 PM [facility] PFT ROOM [facility] None  06/21/2017  4:00 PM Nyoka CowdenWert, Michael B, MD [facility] None

## 2017-06-21 ENCOUNTER — Ambulatory Visit: Payer: BC Managed Care – PPO | Admitting: Internal Medicine

## 2017-08-31 ENCOUNTER — Ambulatory Visit: Payer: BC Managed Care – PPO | Admitting: Family Medicine

## 2017-09-01 ENCOUNTER — Ambulatory Visit: Payer: BC Managed Care – PPO | Admitting: Family Medicine

## 2017-09-05 NOTE — Progress Notes (Signed)
   Tonya Higgins is a 47 y.o. female is here for follow up.  History of Present Illness:   Tonya Higgins, CMA acting as scribe for Dr. Helane Rima.   HPI: Patient had tick bite on right side of bottom about three weeks ago. Soon after she started having pain and swelling in groin. Started about three weeks go. Rash around tick bite but no fever.   There are no preventive care reminders to display for this patient. Depression screen PHQ 2/9 05/17/2017  Decreased Interest 0  Down, Depressed, Hopeless 0  PHQ - 2 Score 0   PMHx, SurgHx, SocialHx, FamHx, Medications, and Allergies were reviewed in the Visit Navigator and updated as appropriate.   Patient Active Problem List   Diagnosis Date Noted  . Mild intermittent asthma 05/20/2017  . Shortness of breath 05/10/2017   Social History   Tobacco Use  . Smoking status: Never Smoker  . Smokeless tobacco: Never Used  Substance Use Topics  . Alcohol use: No  . Drug use: No   Current Medications and Allergies:   .  albuterol (PROVENTIL HFA;VENTOLIN HFA) 108 (90 Base) MCG/ACT inhaler, Inhale 2 puffs into the lungs every 4 (four) hours as needed for wheezing or shortness of breath (or coughing)., Disp: 1 Inhaler, Rfl: 0 .  Multiple Vitamins-Minerals (WOMENS ONE DAILY) TABS, Take 1 tablet by mouth daily., Disp: , Rfl:   No Known Allergies   Review of Systems   Pertinent items are noted in the HPI. Otherwise, ROS is negative.  Vitals:   Vitals:   09/06/17 1003  BP: 108/62  Pulse: 82  Temp: 98.6 F (37 C)  TempSrc: Oral  SpO2: 98%  Weight: 100 lb 12.8 oz (45.7 kg)  Height:  (1.575 m)     Body mass index is 18.44 kg/m.   Physical Exam:   Physical Exam  Constitutional: She appears well-nourished.  HENT:  Head: Normocephalic and atraumatic.  Eyes: Pupils are equal, round, and reactive to light. EOM are normal.  Neck: Normal range of motion. Neck supple.  Cardiovascular: Normal rate, regular rhythm, normal  heart sounds and intact distal pulses.  Pulmonary/Chest: Effort normal.  Abdominal: Soft.  Lymphadenopathy:       Right: Inguinal adenopathy present.  Skin: Skin is warm.  Psychiatric: She has a normal mood and affect. Her behavior is normal.  Nursing note and vitals reviewed.   Assessment and Plan:   Eloina was seen today for groin swelling.  Diagnoses and all orders for this visit:  Inguinal adenopathy -     doxycycline (VIBRA-TABS) 100 MG tablet; Take 1 tablet (100 mg total) by mouth 2 (two) times daily.  Tick bite, initial encounter   . Reviewed expectations re: course of current medical issues. . Discussed self-management of symptoms. . Outlined signs and symptoms indicating need for more acute intervention. . Patient verbalized understanding and all questions were answered. Marland Kitchen Health Maintenance issues including appropriate healthy diet, exercise, and smoking avoidance were discussed with patient. . See orders for this visit as documented in the electronic medical record. . Patient received an After Visit Summary.  Helane Rima, DO [place] 09/06/2017  No future appointments.

## 2017-09-06 ENCOUNTER — Ambulatory Visit: Payer: BC Managed Care – PPO | Admitting: Family Medicine

## 2017-09-06 ENCOUNTER — Encounter: Payer: Self-pay | Admitting: Family Medicine

## 2017-09-06 VITALS — BP 108/62 | HR 82 | Temp 98.6°F | Ht 62.0 in | Wt 100.8 lb

## 2017-09-06 DIAGNOSIS — W57XXXA Bitten or stung by nonvenomous insect and other nonvenomous arthropods, initial encounter: Secondary | ICD-10-CM | POA: Diagnosis not present

## 2017-09-06 DIAGNOSIS — R590 Localized enlarged lymph nodes: Secondary | ICD-10-CM

## 2017-09-06 MED ORDER — DOXYCYCLINE HYCLATE 100 MG PO TABS: 100.0000 mg | ORAL_TABLET | Freq: Two times a day (BID) | ORAL | 0 refills | Status: DC

## 2018-02-02 ENCOUNTER — Ambulatory Visit: Payer: BC Managed Care – PPO | Admitting: Physician Assistant

## 2018-02-02 ENCOUNTER — Ambulatory Visit: Payer: Self-pay

## 2018-02-02 ENCOUNTER — Encounter: Payer: Self-pay | Admitting: Physician Assistant

## 2018-02-02 VITALS — BP 90/60 | HR 74 | Temp 98.4°F | Ht 62.0 in | Wt 100.0 lb

## 2018-02-02 DIAGNOSIS — F419 Anxiety disorder, unspecified: Secondary | ICD-10-CM

## 2018-02-02 DIAGNOSIS — M25512 Pain in left shoulder: Secondary | ICD-10-CM | POA: Diagnosis not present

## 2018-02-02 DIAGNOSIS — R0789 Other chest pain: Secondary | ICD-10-CM

## 2018-02-02 DIAGNOSIS — M79662 Pain in left lower leg: Secondary | ICD-10-CM

## 2018-02-02 NOTE — Telephone Encounter (Signed)
Pt. called with c/o intermittent "heartburn in mid-chest and mid-abdomen"; described as a "burning" sensation.  Denied radiation of the discomfort into throat, neck, or jaw.  Also, c/o left shoulder, arm, and hand discomfort, since working in the yard since Saturday.  Stated the discomfort in the arm is "mild and contstant."  Reported she was digging holes and planting flowers, and is not used to doing yard work; thought her left arm discomfort was related to this.  Reported if she lifts something, the discomfort goes from the left arm to the side of her chest.  Denied shortness of breath, nausea, sweating, or dizziness.  Reported she walks a mile every morning, and denied any symptoms with that activity.  Stated her father passed away, suddenly, last week, and she feels that her symptoms are related to stress.  Pt. stated has not taken any medication for the heartburn, and plans to take this now.   Attempted to contact [facility], since PCP does not have any openings today through Friday.  Unable to reach [facility].  Per protocol, scheduled appt. With Physician Asst. Today at 4:00 PM.  Will send note to office for review, prior to appt.  Care advice given per protocol.  Pt. Verb. Understanding and agrees with plan.          Reason for Disposition . [1] Patient claims chest pain is same as previously diagnosed "heartburn" AND [2] describes burning in chest AND [3] accompanying sour taste in mouth    C/o intermittent "heartburn in mid abdomen and mid chest"; stated has "some burning in chest"; non-radiating; reported previously prescribed Prevacid; had not taken a dose today. . [1] MODERATE pain (e.g., interferes with normal activities) AND [2] present > 3 days    C/o "Mild" left shoulder, arm, and hand pain since doing yard work on Saturday.  Reported left arm feels "heavy."  C/o pain moving into left side of chest with lifting something heavy.  Answer Assessment - Initial Assessment Questions 1. ONSET: "When did the  pain start?"     Saturday 2. LOCATION: "Where is the pain located?"     Left arm pain from shoulder to the hand 3. PAIN: "How bad is the pain?" (Scale 1-10; or mild, moderate, severe)   - MILD (1-3): doesn't interfere with normal activities   - MODERATE (4-7): interferes with normal activities (e.g., work or school) or awakens from sleep   - SEVERE (8-10): excruciating pain, unable to do any normal activities, unable to hold a cup of water     mild 4. WORK OR EXERCISE: "Has there been any recent work or exercise that involved this part of the body?"     Yes; yard work on Saturday; digging holes and planting flowers 5. CAUSE: "What do you think is causing the arm pain?"     Working in the yard 6. OTHER SYMPTOMS: "Do you have any other symptoms?" (e.g., neck pain, swelling, rash, fever, numbness, weakness)     If lifts something heavy, has pain move into the left side of chest 7. PREGNANCY: "Is there any chance you are pregnant?" "When was your last menstrual period?"     No; LMP 9/15  Answer Assessment - Initial Assessment Questions 1. LOCATION: "Where does it hurt?"       Heartburn in middle of chest and abdomen 2. RADIATION: "Does the pain go anywhere else?" (e.g., into neck, jaw, arms, back)     Denied  3. ONSET: "When did the chest pain begin?" (Minutes,  hours or days)     Has been ongoing for awhile; per pt. PCP has treated for this 4. PATTERN "Does the pain come and go, or has it been constant since it started?"  "Does it get worse with exertion?"     Heartburn comes and goes and sometimes constant 5. DURATION: "How long does it last" (e.g., seconds, minutes, hours)     Heartburn lasts 15-30 min. And sometimes it can last the whole afternoon. 6. SEVERITY: "How bad is the pain?"  (e.g., Scale 1-10; mild, moderate, or severe)    - MILD (1-3): doesn't interfere with normal activities     - MODERATE (4-7): interferes with normal activities or awakens from sleep    - SEVERE (8-10):  excruciating pain, unable to do any normal activities        mild 7. CARDIAC RISK FACTORS: "Do you have any history of heart problems or risk factors for heart disease?" (e.g., prior heart attack, angina; high blood pressure, diabetes, being overweight, high cholesterol, smoking, or strong family history of heart disease)     Denies any risk factors above 8. PULMONARY RISK FACTORS: "Do you have any history of lung disease?"  (e.g., blood clots in lung, asthma, emphysema, birth control pills)     Denied the above 9. CAUSE: "What do you think is causing the chest pain?"     Stress; father suddenly passed away last week  10. OTHER SYMPTOMS: "Do you have any other symptoms?" (e.g., dizziness, nausea, vomiting, sweating, fever, difficulty breathing, cough)       Denied the above sx's  11. PREGNANCY: "Is there any chance you are pregnant?" "When was your last menstrual period?"       No ; LMP 9/15  Protocols used: CHEST PAIN-A-AH, ARM PAIN-A-AH

## 2018-02-02 NOTE — Progress Notes (Signed)
Tonya Higgins is a 47 y.o. female here for a follow up of a pre-existing problem.  I acted as a Neurosurgeon for [employer], PA-C Tonya Mull, LPN  History of Present Illness:   Chief Complaint  Patient presents with  . Heartburn  . Left shoulder pain    Heartburn  She complains of heartburn. She reports no abdominal pain, no belching, no coughing, no dysphagia, no nausea or no sore throat. Insomnia. This is a recurrent problem. Episode onset: started 3-4 days ago. The problem has been waxing and waning. The heartburn duration is more than one hour. The heartburn is located in the substernum. The heartburn is of mild intensity. The heartburn does not limit her activity. The heartburn changes with position. The symptoms are aggravated by certain foods and bending. Pertinent negatives include no fatigue, muscle weakness or weight loss. There are no known risk factors. She has tried an antacid and head elevation (Advil) for the symptoms. The treatment provided mild relief. None.   Left shoulder pain Pt c/o left shoulder pain radiating into axilla since Sunday when she was doing yard work. Pain is off and on, rates pain 5/10. Pt took Advil 400 mg with relief. Denies radiation to jaw, worsening symptoms with exertion, n/v.  Left calf pain States that she has had this pain for several months. She is left handed and left leg dominant. Sometimes gets occasional cramps in her leg. She denies SOB or history of blood clots. She has not had any swelling, decreased sensation. She recently did travel to [place] for her father's funeral within the past two weeks.   GAD 7 : Generalized Anxiety Score 02/02/2018  Nervous, Anxious, on Edge 0  Control/stop worrying 1  Worry too much - different things 1  Trouble relaxing 1  Restless 0  Easily annoyed or irritable 0  Afraid - awful might happen 1  Total GAD 7 Score 4  Anxiety Difficulty Not difficult at all     No past medical history on file.    Social History   Socioeconomic History  . Marital status: Married    Spouse name: Not on file  . Number of children: Not on file  . Years of education: Not on file  . Highest education level: Not on file  Occupational History  . Not on file  Social Needs  . Financial resource strain: Not on file  . Food insecurity:    Worry: Not on file    Inability: Not on file  . Transportation needs:    Medical: Not on file    Non-medical: Not on file  Tobacco Use  . Smoking status: Never Smoker  . Smokeless tobacco: Never Used  Substance and Sexual Activity  . Alcohol use: No  . Drug use: No  . Sexual activity: Yes    Partners: Male    Birth control/protection: None  Lifestyle  . Physical activity:    Days per week: Not on file    Minutes per session: Not on file  . Stress: Not on file  Relationships  . Social connections:    Talks on phone: Not on file    Gets together: Not on file    Attends religious service: Not on file    Active member of club or organization: Not on file    Attends meetings of clubs or organizations: Not on file    Relationship status: Not on file  . Intimate partner violence:    Fear of current or  ex partner: Not on file    Emotionally abused: Not on file    Physically abused: Not on file    Forced sexual activity: Not on file  Other Topics Concern  . Not on file  Social History Narrative   Married with two children.    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    Past Surgical History:  Procedure Laterality Date  . CESAREAN SECTION      Family History  Problem Relation Age of Onset  . Diabetes Father   . Asthma Child   . Breast cancer Neg Hx     No Known Allergies  Current Medications:   Current Outpatient Medications:  .  doxycycline (VIBRA-TABS) 100 MG tablet, Take 1 tablet (100 mg total) by mouth 2 (two) times daily., Disp: 20 tablet, Rfl: 0 .  Multiple Vitamins-Minerals (WOMENS ONE DAILY) TABS, Take 1 tablet by mouth daily., Disp: , Rfl:     Review of Systems:   Review of Systems  Constitutional: Negative for fatigue and weight loss.  HENT: Negative for sore throat.   Respiratory: Negative for cough.   Gastrointestinal: Positive for heartburn. Negative for abdominal pain, dysphagia and nausea.  Musculoskeletal: Negative for muscle weakness.    Vitals:   Vitals:   02/02/18 1615  BP: 90/60  Pulse: 74  Temp: 98.4 F (36.9 C)  TempSrc: Oral  SpO2: 98%  Weight: 100 lb (45.4 kg)  Height: 5\' 2"  (1.575 m)     Body mass index is 18.29 kg/m.  Physical Exam:   Physical Exam  Constitutional: She appears well-developed. She is cooperative.  Non-toxic appearance. She does not have a sickly appearance. She does not appear ill. No distress.  Cardiovascular: Normal rate, regular rhythm, S1 normal, S2 normal, normal heart sounds and normal pulses.  Pulses:      Dorsalis pedis pulses are 2+ on the right side, and 2+ on the left side.  No LE edema  Pulmonary/Chest: Effort normal and breath sounds normal.  Abdominal: Normal appearance and bowel sounds are normal. There is no tenderness.  Musculoskeletal:  Bilateral legs without edema, equal circumference of bilateral calves. Negative Homan's sign bilaterally. Mild tenderness with palpation to L calf.  No reproducible tenderness to cervical spine, cervical paraspinal muscles. L shoulder without visible deformities or decreased ROM with passive and resisted ROM.  Neurological: She is alert. GCS eye subscore is 4. GCS verbal subscore is 5. GCS motor subscore is 6.  Grip strength 5/5 bilat  Skin: Skin is warm, dry and intact.  Psychiatric: She has a normal mood and affect. Her speech is normal and behavior is normal.  Nursing note and vitals reviewed.  EKG tracing is personally reviewed.  EKG notes NSR.  No acute changes.   Assessment and Plan:    [name] was seen today for heartburn and left shoulder pain.  Diagnoses and all orders for this visit:  Chest  discomfort EKG without acute changes. She received GI cocktail in office with almost complete resolution of symptoms. Suspect heartburn due to stress. Discussed avoiding trigger foods and working on stress management as able. Follow-up if symptoms worsen or persist despite treatment. No red flags on exam. Vitals stable. No exertional component to pain. -     EKG 12-Lead  Pain of left calf Long discussion regarding this. We discussed low likelihood of DVT, however she is at risk due to recent long flight. No other risk factors. She opted to wait to see if her symptoms improved, and verbalized  known risks to possibility of blood clot. She states several times that pain has been there for months and has not increased since recent flight. We also discussed option of getting D-Dimer but she also declined. Follow-up if symptoms worsen or persist.  Acute pain of left shoulder Suspect likely strain from recent yardwork. Follow-up with Dr. Berline Chough if symptoms worsen or persist despite treatment.  Anxiety Although GAD-7 score is only 4 today she still admits to significant stress recently with her father passing. She is having difficulty sleeping, however states that reassurance of EKG at today's visit will provide her with significant reassurance and she declines further intervention at this time. I recommended that she follow-up if symptoms worsen, she verbalized understanding.   . Reviewed expectations re: course of current medical issues. . Discussed self-management of symptoms. . Outlined signs and symptoms indicating need for more acute intervention. . Patient verbalized understanding and all questions were answered. . See orders for this visit as documented in the electronic medical record. . Patient received an After-Visit Summary.  CMA or LPN served as scribe during this visit. History, Physical, and Plan performed by medical provider. The above documentation has been reviewed and is accurate and  complete.   Jarold Motto, PA-C

## 2018-02-02 NOTE — Patient Instructions (Signed)
It was great to see you!  Follow-up if symptoms worsen or persist despite treatment.  If changes in pain or sudden shortness of breath, please go to the ER!  Take care, Tonya MastSamantha

## 2018-02-02 NOTE — Telephone Encounter (Signed)
See note

## 2018-02-04 ENCOUNTER — Telehealth: Payer: Self-pay | Admitting: *Deleted

## 2018-02-04 NOTE — Telephone Encounter (Signed)
Called patient to discuss Mychart appt. She states that her symptoms are similar to her office visit on 02/02/18. Patient states that her rib cage has been hurting - worse after eating and worse at night. Patient feels like it is heart burn. Denies shortness of breath, dizziness, lightheadedness.  Patient thinks she may need chest xray. Spoke to Dr Earlene Plater regarding this. Monday appt ok.

## 2018-02-06 NOTE — Progress Notes (Signed)
   Tonya Higgins is a 47 y.o. female is here for follow up.  History of Present Illness:   Abdominal Pain  This is a recurrent problem. The current episode started 1 to 4 weeks ago. The onset quality is gradual. The problem occurs intermittently. The problem has been gradually worsening. The pain is located in the epigastric region. The pain is mild. The quality of the pain is colicky, burning, aching and dull. The abdominal pain radiates to the left shoulder and chest. Pertinent negatives include no constipation, diarrhea, fever, hematochezia, melena, nausea, vomiting or weight loss. The pain is aggravated by NSAIDs. The pain is relieved by nothing. She has tried nothing for the symptoms. There is no history of gallstones, irritable bowel syndrome or pancreatitis.   There are no preventive care reminders to display for this patient.   Depression screen PHQ 2/9 05/17/2017  Decreased Interest 0  Down, Depressed, Hopeless 0  PHQ - 2 Score 0   PMHx, SurgHx, SocialHx, FamHx, Medications, and Allergies were reviewed in the Visit Navigator and updated as appropriate.   Patient Active Problem List   Diagnosis Date Noted  . Mild intermittent asthma 05/20/2017  . Shortness of breath 05/10/2017   Social History   Tobacco Use  . Smoking status: Never Smoker  . Smokeless tobacco: Never Used  Substance Use Topics  . Alcohol use: No  . Drug use: No   Current Medications and Allergies:   Marland Kitchen  Multiple Vitamins-Minerals (WOMENS ONE DAILY) TABS, Take 1 tablet by mouth daily., Disp: , Rfl:   No Known Allergies   Review of Systems   Pertinent items are noted in the HPI. Otherwise, ROS is negative.  Vitals:   Vitals:   02/07/18 0922  BP: 102/66  Pulse: 66  Temp: 98.2 F (36.8 C)  TempSrc: Oral  SpO2: 100%  Weight: 99 lb 3.2 oz (45 kg)  Height: 5\' 2"  (1.575 m)     Body mass index is 18.14 kg/m.  Physical Exam:   Physical Exam  Constitutional: She appears well-nourished.  HENT:   Head: Normocephalic and atraumatic.  Eyes: Pupils are equal, round, and reactive to light. EOM are normal.  Neck: Normal range of motion. Neck supple.  Cardiovascular: Normal rate, regular rhythm, normal heart sounds and intact distal pulses.  Pulmonary/Chest: Effort normal.  Abdominal: Soft. There is tenderness in the epigastric area.  Skin: Skin is warm.  Psychiatric: She has a normal mood and affect. Her behavior is normal.  Nursing note and vitals reviewed.  Assessment and Plan:   Diagnoses and all orders for this visit:  Dyspepsia -     pantoprazole (PROTONIX) 40 MG tablet; Take 1 tablet (40 mg total) by mouth daily. -     ranitidine (ZANTAC) 150 MG tablet; Take 1 tablet (150 mg total) by mouth 2 (two) times daily.   . Reviewed expectations re: course of current medical issues. . Discussed self-management of symptoms. . Outlined signs and symptoms indicating need for more acute intervention. . Patient verbalized understanding and all questions were answered. Marland Kitchen Health Maintenance issues including appropriate healthy diet, exercise, and smoking avoidance were discussed with patient. . See orders for this visit as documented in the electronic medical record. . Patient received an After Visit Summary.  Helane Rima, DO [place], [facility] 02/07/2018

## 2018-02-07 ENCOUNTER — Encounter: Payer: Self-pay | Admitting: Family Medicine

## 2018-02-07 ENCOUNTER — Ambulatory Visit: Payer: BC Managed Care – PPO | Admitting: Family Medicine

## 2018-02-07 VITALS — BP 102/66 | HR 66 | Temp 98.2°F | Ht 62.0 in | Wt 99.2 lb

## 2018-02-07 DIAGNOSIS — R1013 Epigastric pain: Secondary | ICD-10-CM | POA: Diagnosis not present

## 2018-02-07 MED ORDER — PANTOPRAZOLE SODIUM 40 MG PO TBEC: 40.0000 mg | DELAYED_RELEASE_TABLET | Freq: Every day | ORAL | 3 refills | Status: DC

## 2018-02-07 MED ORDER — RANITIDINE HCL 150 MG PO TABS: 150.0000 mg | ORAL_TABLET | Freq: Two times a day (BID) | ORAL | 1 refills | Status: DC

## 2018-02-22 ENCOUNTER — Ambulatory Visit (INDEPENDENT_AMBULATORY_CARE_PROVIDER_SITE_OTHER): Payer: BC Managed Care – PPO | Admitting: Surgical

## 2018-02-22 ENCOUNTER — Other Ambulatory Visit (INDEPENDENT_AMBULATORY_CARE_PROVIDER_SITE_OTHER): Payer: BC Managed Care – PPO

## 2018-02-22 DIAGNOSIS — R5383 Other fatigue: Secondary | ICD-10-CM | POA: Diagnosis not present

## 2018-02-22 DIAGNOSIS — Z23 Encounter for immunization: Secondary | ICD-10-CM

## 2018-02-22 LAB — CBC WITH DIFFERENTIAL/PLATELET
Basophils Absolute: 0 10*3/uL (ref 0.0–0.1)
Basophils Relative: 0.7 % (ref 0.0–3.0)
Eosinophils Absolute: 0.4 10*3/uL (ref 0.0–0.7)
Eosinophils Relative: 6.4 % — ABNORMAL HIGH (ref 0.0–5.0)
HCT: 41 % (ref 36.0–46.0)
Hemoglobin: 13.3 g/dL (ref 12.0–15.0)
Lymphocytes Relative: 40.7 % (ref 12.0–46.0)
Lymphs Abs: 2.7 10*3/uL (ref 0.7–4.0)
MCHC: 32.4 g/dL (ref 30.0–36.0)
MCV: 80.8 fl (ref 78.0–100.0)
Monocytes Absolute: 0.7 10*3/uL (ref 0.1–1.0)
Monocytes Relative: 10.8 % (ref 3.0–12.0)
Neutro Abs: 2.8 10*3/uL (ref 1.4–7.7)
Neutrophils Relative %: 41.4 % — ABNORMAL LOW (ref 43.0–77.0)
Platelets: 323 10*3/uL (ref 150.0–400.0)
RBC: 5.08 Mil/uL (ref 3.87–5.11)
RDW: 13.1 % (ref 11.5–15.5)
WBC: 6.7 10*3/uL (ref 4.0–10.5)

## 2018-02-23 LAB — IRON,TIBC AND FERRITIN PANEL
%SAT: 15 % (calc) — ABNORMAL LOW (ref 16–45)
Ferritin: 10 ng/mL — ABNORMAL LOW (ref 16–232)
Iron: 61 ug/dL (ref 40–190)
TIBC: 404 mcg/dL (calc) (ref 250–450)

## 2018-03-01 ENCOUNTER — Encounter: Payer: Self-pay | Admitting: Family Medicine

## 2018-03-01 ENCOUNTER — Ambulatory Visit: Payer: BC Managed Care – PPO | Admitting: Family Medicine

## 2018-03-01 VITALS — BP 102/64 | HR 78 | Temp 98.2°F | Ht 62.0 in | Wt 98.0 lb

## 2018-03-01 DIAGNOSIS — R1013 Epigastric pain: Secondary | ICD-10-CM | POA: Diagnosis not present

## 2018-03-01 DIAGNOSIS — L858 Other specified epidermal thickening: Secondary | ICD-10-CM

## 2018-03-01 DIAGNOSIS — R790 Abnormal level of blood mineral: Secondary | ICD-10-CM

## 2018-03-01 MED ORDER — TRIAMCINOLONE ACETONIDE 0.025 % EX OINT: 1.0000 "application " | TOPICAL_OINTMENT | Freq: Two times a day (BID) | CUTANEOUS | 0 refills | Status: DC

## 2018-03-01 MED ORDER — SALICYLIC ACID-UREA 5-10 % EX OINT: TOPICAL_OINTMENT | CUTANEOUS | 3 refills | Status: DC

## 2018-03-01 NOTE — Progress Notes (Signed)
Tonya Higgins is a 47 y.o. female is here for follow up.  History of Present Illness:   HPI:   1. Rash: bilateral outer arms and thighs as well as upper buttock. Itchy. Worse at night. No treatment. Was recently in [place] and is worried that she was exposed to something there.    2. Dyspepsia. Ongoing. Taking medications as prescribed. Some weight loss.    3. Low ferritin level. See below. Patient brought her son in to be seen last week. I noted that she appeared pale, so ordered labs. CBC normal, but ferritin low.    Results for orders placed or performed in visit on 02/22/18  CBC with Differential/Platelet  Result Value Ref Range   WBC 6.7 4.0 - 10.5 K/uL   RBC 5.08 3.87 - 5.11 Mil/uL   Hemoglobin 13.3 12.0 - 15.0 g/dL   HCT 09.8 11.9 - 14.7 %   MCV 80.8 78.0 - 100.0 fl   MCHC 32.4 30.0 - 36.0 g/dL   RDW 82.9 56.2 - 13.0 %   Platelets 323.0 150.0 - 400.0 K/uL   Neutrophils Relative % 41.4 (L) 43.0 - 77.0 %   Lymphocytes Relative 40.7 12.0 - 46.0 %   Monocytes Relative 10.8 3.0 - 12.0 %   Eosinophils Relative 6.4 (H) 0.0 - 5.0 %   Basophils Relative 0.7 0.0 - 3.0 %   Neutro Abs 2.8 1.4 - 7.7 K/uL   Lymphs Abs 2.7 0.7 - 4.0 K/uL   Monocytes Absolute 0.7 0.1 - 1.0 K/uL   Eosinophils Absolute 0.4 0.0 - 0.7 K/uL   Basophils Absolute 0.0 0.0 - 0.1 K/uL  Iron, TIBC and Ferritin Panel  Result Value Ref Range   Iron 61 40 - 190 mcg/dL   TIBC 865 784 - 696 mcg/dL (calc)   %SAT 15 (L) 16 - 45 % (calc)   Ferritin 10 (L) 16 - 232 ng/mL   Depression screen PHQ 2/9 05/17/2017  Decreased Interest 0  Down, Depressed, Hopeless 0  PHQ - 2 Score 0   PMHx, SurgHx, SocialHx, FamHx, Medications, and Allergies were reviewed in the Visit Navigator and updated as appropriate.   Patient Active Problem List   Diagnosis Date Noted  . Mild intermittent asthma 05/20/2017  . Shortness of breath 05/10/2017   Social History   Tobacco Use  . Smoking status: Never Smoker  . Smokeless  tobacco: Never Used  Substance Use Topics  . Alcohol use: No  . Drug use: No   Current Medications and Allergies:   Marland Kitchen  Multiple Vitamins-Minerals (WOMENS ONE DAILY) TABS, Take 1 tablet by mouth daily., Disp: , Rfl:  .  pantoprazole (PROTONIX) 40 MG tablet, Take 1 tablet (40 mg total) by mouth daily., Disp: 30 tablet, Rfl: 3 .  ranitidine (ZANTAC) 150 MG tablet, Take 1 tablet (150 mg total) by mouth 2 (two) times daily., Disp: 30 tablet, Rfl: 1  No Known Allergies   Review of Systems   Pertinent items are noted in the HPI. Otherwise, ROS is negative.  Vitals:   Vitals:   03/01/18 0752  BP: 102/64  Pulse: 78  Temp: 98.2 F (36.8 C)  TempSrc: Oral  SpO2: 99%  Weight: 98 lb (44.5 kg)  Height: 5\' 2"  (1.575 m)     Body mass index is 17.92 kg/m.  Physical Exam:   Physical Exam  Constitutional: She appears well-nourished.  HENT:  Head: Normocephalic and atraumatic.  Eyes: Pupils are equal, round, and reactive to light. EOM are normal.  Neck: Normal range of motion. Neck supple.  Cardiovascular: Normal rate, regular rhythm, normal heart sounds and intact distal pulses.  Pulmonary/Chest: Effort normal.  Abdominal: Soft.  Skin: Skin is warm.  Keratosis pilaris.   Psychiatric: She has a normal mood and affect. Her behavior is normal.  Nursing note and vitals reviewed.  Assessment and Plan:   Diagnoses and all orders for this visit:  Keratosis pilaris -     Salicylic Acid-Urea 5-10 % OINT; Apply to affected areas BID -     triamcinolone (KENALOG) 0.025 % ointment; Apply 1 application topically 2 (two) times daily.  Dyspepsia -     Ambulatory referral to Gastroenterology  Low ferritin level Comments: She has already started iron supplementation. Will monitor and recheck in 1-3 months.    . Reviewed expectations re: course of current medical issues. . Discussed self-management of symptoms. . Outlined signs and symptoms indicating need for more acute  intervention. . Patient verbalized understanding and all questions were answered. Marland Kitchen Health Maintenance issues including appropriate healthy diet, exercise, and smoking avoidance were discussed with patient. . See orders for this visit as documented in the electronic medical record. . Patient received an After Visit Summary.  Helane Rima, DO [place], [facility] 03/06/2018

## 2018-03-08 ENCOUNTER — Encounter: Payer: Self-pay | Admitting: Gastroenterology

## 2018-03-25 ENCOUNTER — Encounter: Payer: Self-pay | Admitting: Gastroenterology

## 2018-03-25 ENCOUNTER — Ambulatory Visit: Payer: BC Managed Care – PPO | Admitting: Gastroenterology

## 2018-03-25 VITALS — BP 94/68 | HR 70 | Ht 62.0 in | Wt 100.0 lb

## 2018-03-25 DIAGNOSIS — K219 Gastro-esophageal reflux disease without esophagitis: Secondary | ICD-10-CM

## 2018-03-25 DIAGNOSIS — R12 Heartburn: Secondary | ICD-10-CM

## 2018-03-25 MED ORDER — FAMOTIDINE 20 MG PO TABS: 20.0000 mg | ORAL_TABLET | Freq: Two times a day (BID) | ORAL | 2 refills | Status: DC | PRN

## 2018-03-25 NOTE — Patient Instructions (Signed)
We have sent the following medications to your pharmacy for you to pick up at your convenience: pepcid 20 mg 1 tablet by mouth as needed for heartburn. It can be used up to 2 times daily.

## 2018-03-25 NOTE — Progress Notes (Signed)
GASTROENTEROLOGY OUTPATIENT CLINIC VISIT   Primary Care Provider Helane Rima, DO [address] [place] [address] [phone]  Referring Provider Helane Rima, DO [address] [place], [place] [address] [phone]  Patient Profile: Tonya Higgins is a 47 y.o. female without a significant pmh other than a single episode of short standing GERD.  The patient presents to the [facility] for an evaluation and management of problem(s) noted below:  Problem List 1. Gastroesophageal reflux disease, esophagitis presence not specified   2. Pyrosis     History of Present Illness: This is the patient's first visit to the [facility] outpatient GI clinic.  The patient describes about a year ago in December 2018 the development of shortness of breath as well as a slight burning sensation in her chest region that led her to be evaluated by her primary care physician as well as cardiology and then subsequently by pulmonary.  It was felt that she did not have any other underlying cardiac issues or pulmonary issues such as asthma playing a role.  She was eventually recommended Prilosec.  She took that for course of approximately 2 weeks to 4 weeks and after stopping it she was doing extremely well.  She describes after about a week worth of therapy she was feeling much better with complete resolution of her symptoms.  She did well over the course of the next few months.  However in September of this year the patient redeveloped issues of a burning sensation in the mid sternal region.  She saw her primary care doctor who prescribed her both omeprazole as well as Zantac.  Shortly after starting both of these medications the patient developed a rash on her bilateral upper extremities on the backs of her arms in the triceps region.  She stopped both medications.  Patient has been watching her diet closely and has noted that foods that are spicy as well as tomato based foods  can at times cause patient to have more significant burning sensation as well as some acid reflux.  She describes since being a child that she had difficulty swallowing pills however she describes no liquid or true solid food dysphagia.  She described having some significant worsening of discomfort when she ate foods and that things could get worse after eating.  Milk would actually ease the discomfort she was having in her substernal region as well as midepigastrium.  For the rash she was started on some creams by her primary care physician she has not seen dermatology.  She does not take significant nonsteroidals or BC/Goody powders.  She is never had an upper or lower endoscopy.  She has no family history of GI malignancies that she is aware of.  She is feeling much better at this point in time by watching her diet but she is wondering if this is going to have to be a long-term medication use and/or if symptoms are going to recur.  She is concerned about the use of Zantac hearing the recent FDA warnings and thus stopped taking Zantac as well.  She is also concerned whether these medications were the cause of her rash but they persist even after months of being off of both these medications.  GI Review of Systems Positive as above Negative for odynophagia, current abdominal pain, nausea, vomiting, melena, hematochezia, change in bowel habits  Review of Systems General: Denies fevers/chills/weight loss HEENT: Denies oral lesions Cardiovascular: Denies chest pain Pulmonary: Denies shortness of breath Gastroenterological: See HPI  Genitourinary: Denies darkened urine Hematological: Denies easy bruising/bleeding Endocrine: Denies temperature intolerance Dermatological: Denies jaundice Psychological: Mood is stable Musculoskeletal: Denies new arthralgias   Medications Current Outpatient Medications  Medication Sig Dispense Refill  . Multiple Vitamins-Minerals (WOMENS ONE DAILY) TABS Take 1 tablet  by mouth daily.    . Salicylic Acid-Urea 5-10 % OINT Apply to affected areas BID 30 g 3  . famotidine (PEPCID) 20 MG tablet Take 1 tablet (20 mg total) by mouth 2 (two) times daily as needed for heartburn or indigestion. 30 tablet 2   No current facility-administered medications for this visit.     Allergies No Known Allergies  Histories History reviewed. No pertinent past medical history. Past Surgical History:  Procedure Laterality Date  . CESAREAN SECTION     2006 and 2012   Social History   Socioeconomic History  . Marital status: Married    Spouse name: Not on file  . Number of children: Not on file  . Years of education: Not on file  . Highest education level: Not on file  Occupational History  . Not on file  Social Needs  . Financial resource strain: Not on file  . Food insecurity:    Worry: Not on file    Inability: Not on file  . Transportation needs:    Medical: Not on file    Non-medical: Not on file  Tobacco Use  . Smoking status: Never Smoker  . Smokeless tobacco: Never Used  Substance and Sexual Activity  . Alcohol use: Yes    Comment: occassionally  . Drug use: No  . Sexual activity: Yes    Partners: Male    Birth control/protection: None  Lifestyle  . Physical activity:    Days per week: Not on file    Minutes per session: Not on file  . Stress: Not on file  Relationships  . Social connections:    Talks on phone: Not on file    Gets together: Not on file    Attends religious service: Not on file    Active member of club or organization: Not on file    Attends meetings of clubs or organizations: Not on file    Relationship status: Not on file  . Intimate partner violence:    Fear of current or ex partner: Not on file    Emotionally abused: Not on file    Physically abused: Not on file    Forced sexual activity: Not on file  Other Topics Concern  . Not on file  Social History Narrative   Married with two children.    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   Family History  Problem Relation Age of Onset  . Diabetes Father   . Asthma Child   . Breast cancer Neg Hx   . Colon cancer Neg Hx   . Esophageal cancer Neg Hx   . Stomach cancer Neg Hx   . Inflammatory bowel disease Neg Hx   . Liver disease Neg Hx   . Pancreatic cancer Neg Hx   . Rectal cancer Neg Hx    I have reviewed her medical, social, and family history in detail and updated the electronic medical record as necessary.    PHYSICAL EXAMINATION  BP 94/68   Pulse 70   Ht 5\' 2"  (1.575 m)   Wt 100 lb (45.4 kg)   LMP 03/20/2018 (Exact Date)   BMI 18.29 kg/m  Wt Readings from Last 3 Encounters:  03/25/18 100 lb (45.4  kg)  03/01/18 98 lb (44.5 kg)  02/07/18 99 lb 3.2 oz (45 kg)  GEN: NAD, appears stated age, doesn't appear chronically ill PSYCH: Cooperative, without pressured speech EYE: Conjunctivae pink, sclerae anicteric ENT: MMM, without oral ulcers, no erythema or exudates noted NECK: Supple CV: RR without R/Gs  RESP: CTAB posteriorly, without wheezing GI: NABS, soft, NT/ND, without rebound or guarding, no HSM appreciated MSK/EXT: No lower extremity edema SKIN: No jaundice NEURO:  Alert & Oriented x 3, no focal deficits   REVIEW OF DATA  I reviewed the following data at the time of this encounter:  GI Procedures and Studies  No relevant studies to review  Laboratory Studies  Reviewed in epic  Imaging Studies  No relevant studies to review   ASSESSMENT  Ms. Suriano is a 47 y.o. female without a significant pmh other than a single episode of short standing GERD.  The patient is seen today for evaluation and management of:  1. Gastroesophageal reflux disease, esophagitis presence not specified   2. Pyrosis    This is a clinically and hemodynamically stable patient who presents with at least 2 episodes of significant pyrosis/GERD symptoms.  Her more recent issues in the fall of this year are suggestive of recurrent GERD.  Unfortunately  she was only able to tolerate a short period in time on PPI because of the development of rash which I am not clear as result of either the Zantac or the PPI and as she is previously had PPI in the past and did not have issues with it I am not clear that the rash has anything to do with the medication she was on.  With that being said the patient is trying to modify her lifestyle in terms of her diet and has such improved her heartburn symptoms.  With that being said she has had intermittent episodes occur when she knows certain foods are going to be on her list of things to eat during that day or prepare for her family.  We discussed the role of Pepcid utilization which does not have the FDA warning currently as does the cause and Zantac.  She would like to hold on the use of omeprazole unless symptoms become recurrent and use of the Pepcid on an as-needed basis.  As she has no issues of true solid or liquid food dysphagia I think this is reasonable however if symptoms become recurrent requiring multiple days of Pepcid then we discussed the role of restarting omeprazole and continuing that for period of at least 6 to 8 weeks at which point we would down titrate her medications and see if we can get her off again.  However if she is having multiple recurrent bouts of this in the setting of being treated with courses of PPI she may be a person who will need daily medication.  We would at that point in time in the setting of refractory GERD symptoms, if these were to occur, then consider an upper endoscopy.  She is not due for colon cancer screening until the age of 49.  All patient questions were answered, to the best of my ability, and the patient agrees to the aforementioned plan of action with follow-up as indicated.   PLAN  1. Gastroesophageal reflux disease, esophagitis presence not specified  2. Pyrosis - May start Pepcid BID PRN for on-demand GERD symptoms for short periods of time - If recurrent  symptoms or more frequent GERD symptoms then patient will restart Omeprazole at  40 mg BID for a period of 352-months and then decrease to 40 mg QD and then try to get back down to 20 mg QD; further or refractory symptoms may require persistent/chronic medication use and then also require an EGD   No orders of the defined types were placed in this encounter.   New Prescriptions   FAMOTIDINE (PEPCID) 20 MG TABLET    Take 1 tablet (20 mg total) by mouth 2 (two) times daily as needed for heartburn or indigestion.   Modified Medications   No medications on file    Planned Follow Up: Return if symptoms worsen or fail to improve.   Corliss ParishGabriel Mansouraty, MD  Gastroenterology Advanced Endoscopy Office # [phone]

## 2018-03-29 ENCOUNTER — Encounter: Payer: Self-pay | Admitting: Gastroenterology

## 2018-03-29 DIAGNOSIS — K219 Gastro-esophageal reflux disease without esophagitis: Secondary | ICD-10-CM | POA: Insufficient documentation

## 2018-03-29 DIAGNOSIS — R12 Heartburn: Secondary | ICD-10-CM | POA: Insufficient documentation

## 2018-04-06 NOTE — Progress Notes (Signed)
Tonya Higgins is a 47 y.o. female here for an acute visit.  History of Present Illness:   Tonya Higgins, CMA acting as scribe for Dr. Helane RimaErica Delmi Higgins.   HPI: Patient in office for follow up on labs. Feeling better. Exercising and eating well. Taking iron.  Keratosis pilaris Started on Salicylic Acid-Urea 5-10 % OINT; Apply to affected areas BID and  triamcinolone (KENALOG) 0.025 % ointment; Apply 1 application topically 2 (two) times daily at last visit. She states that it has not helped much at all.   Dyspepsia Referral to GI started at last office visit she was seen by Dr. Meridee Higgins on 03/25/18. Diagnosed with GERD. Started on Pepcid BID PRN for on-demand GERD symptoms for short periods of time  Low ferritin level She has already started iron supplementation. Last ferritin was 10 on 02/22/18. She has been taking iron supplements daily with no side effects.   PMHx, SurgHx, SocialHx, Medications, and Allergies were reviewed in the Visit Navigator and updated as appropriate.  Current Medications:   Current Outpatient Medications:  .  famotidine (PEPCID) 20 MG tablet, Take 1 tablet (20 mg total) by mouth 2 (two) times daily as needed for heartburn or indigestion., Disp: 30 tablet, Rfl: 2 .  Multiple Vitamins-Minerals (WOMENS ONE DAILY) TABS, Take 1 tablet by mouth daily., Disp: , Rfl:  .  Salicylic Acid-Urea 5-10 % OINT, Apply to affected areas BID, Disp: 30 g, Rfl: 3   No Known Allergies   Review of Systems:   Pertinent items are noted in the HPI. Otherwise, ROS is negative.  Vitals:   Vitals:   04/11/18 0748  BP: 100/64  Pulse: 99  Temp: 98.4 F (36.9 C)  TempSrc: Oral  SpO2: 99%  Weight: 97 lb 9.6 oz (44.3 kg)  Height: 5\' 2"  (1.575 m)     Body mass index is 17.85 kg/m.  Physical Exam:   Physical Exam  Constitutional: She appears well-nourished.  HENT:  Head: Normocephalic and atraumatic.  Eyes: Pupils are equal, round, and reactive to light. EOM are  normal.  Neck: Normal range of motion. Neck supple.  Cardiovascular: Normal rate, regular rhythm, normal heart sounds and intact distal pulses.  Pulmonary/Chest: Effort normal.  Abdominal: Soft.  Skin: Skin is warm.  Psychiatric: She has a normal mood and affect. Her behavior is normal.  Nursing note and vitals reviewed.    Results for orders placed or performed in visit on 02/22/18  CBC with Differential/Platelet  Result Value Ref Range   WBC 6.7 4.0 - 10.5 K/uL   RBC 5.08 3.87 - 5.11 Mil/uL   Hemoglobin 13.3 12.0 - 15.0 g/dL   HCT 45.441.0 09.836.0 - 11.946.0 %   MCV 80.8 78.0 - 100.0 fl   MCHC 32.4 30.0 - 36.0 g/dL   RDW 14.713.1 82.911.5 - 56.215.5 %   Platelets 323.0 150.0 - 400.0 K/uL   Neutrophils Relative % 41.4 (L) 43.0 - 77.0 %   Lymphocytes Relative 40.7 12.0 - 46.0 %   Monocytes Relative 10.8 3.0 - 12.0 %   Eosinophils Relative 6.4 (H) 0.0 - 5.0 %   Basophils Relative 0.7 0.0 - 3.0 %   Neutro Abs 2.8 1.4 - 7.7 K/uL   Lymphs Abs 2.7 0.7 - 4.0 K/uL   Monocytes Absolute 0.7 0.1 - 1.0 K/uL   Eosinophils Absolute 0.4 0.0 - 0.7 K/uL   Basophils Absolute 0.0 0.0 - 0.1 K/uL  Iron, TIBC and Ferritin Panel  Result Value Ref Range   Iron 61  40 - 190 mcg/dL   TIBC 782 956 - 213 mcg/dL (calc)   %SAT 15 (L) 16 - 45 % (calc)   Ferritin 10 (L) 16 - 232 ng/mL    Assessment and Plan:   Tonya Higgins was seen today for follow-up.  Diagnoses and all orders for this visit:  Low ferritin level -     CBC with Differential/Platelet -     Iron, TIBC and Ferritin Panel  Gastroesophageal reflux disease, esophagitis presence not specified -     CBC with Differential/Platelet -     Comprehensive metabolic panel  Screening for lipid disorders -     Lipid panel  Keratosis pilaris Comments: SEE AVS.   . Reviewed expectations re: course of current medical issues. . Discussed self-management of symptoms. . Outlined signs and symptoms indicating need for more acute intervention. . Patient verbalized  understanding and all questions were answered. Marland Kitchen Health Maintenance issues including appropriate healthy diet, exercise, and smoking avoidance were discussed with patient. . See orders for this visit as documented in the electronic medical record. . Patient received an After Visit Summary.  CMA served as Neurosurgeon during this visit. History, Physical, and Plan performed by medical provider. The above documentation has been reviewed and is accurate and complete. Helane Rima, D.O.  Helane Rima, DO [place], [facility] 04/11/2018

## 2018-04-11 ENCOUNTER — Ambulatory Visit: Payer: BC Managed Care – PPO | Admitting: Family Medicine

## 2018-04-11 ENCOUNTER — Encounter: Payer: Self-pay | Admitting: Family Medicine

## 2018-04-11 VITALS — BP 100/64 | HR 99 | Temp 98.4°F | Ht 62.0 in | Wt 97.6 lb

## 2018-04-11 DIAGNOSIS — Z13220 Encounter for screening for lipoid disorders: Secondary | ICD-10-CM

## 2018-04-11 DIAGNOSIS — R790 Abnormal level of blood mineral: Secondary | ICD-10-CM

## 2018-04-11 DIAGNOSIS — L858 Other specified epidermal thickening: Secondary | ICD-10-CM | POA: Diagnosis not present

## 2018-04-11 DIAGNOSIS — K219 Gastro-esophageal reflux disease without esophagitis: Secondary | ICD-10-CM

## 2018-04-11 HISTORY — DX: Other specified epidermal thickening: L85.8

## 2018-04-11 LAB — COMPREHENSIVE METABOLIC PANEL
ALT: 15 U/L (ref 0–35)
AST: 11 U/L (ref 0–37)
Albumin: 4.8 g/dL (ref 3.5–5.2)
Alkaline Phosphatase: 55 U/L (ref 39–117)
BUN: 19 mg/dL (ref 6–23)
CO2: 29 mEq/L (ref 19–32)
Calcium: 10.2 mg/dL (ref 8.4–10.5)
Chloride: 103 mEq/L (ref 96–112)
Creatinine, Ser: 0.87 mg/dL (ref 0.40–1.20)
GFR: 74.03 mL/min (ref >60.00)
Glucose, Bld: 121 mg/dL — ABNORMAL HIGH (ref 70–99)
Potassium: 4.1 mEq/L (ref 3.5–5.1)
Sodium: 141 mEq/L (ref 135–145)
Total Bilirubin: 0.6 mg/dL (ref 0.2–1.2)
Total Protein: 7.4 g/dL (ref 6.0–8.3)

## 2018-04-11 LAB — CBC WITH DIFFERENTIAL/PLATELET
Basophils Absolute: 0 10*3/uL (ref 0.0–0.1)
Basophils Relative: 0.7 % (ref 0.0–3.0)
Eosinophils Absolute: 0.3 10*3/uL (ref 0.0–0.7)
Eosinophils Relative: 4.7 % (ref 0.0–5.0)
HCT: 43.5 % (ref 36.0–46.0)
Hemoglobin: 14 g/dL (ref 12.0–15.0)
Lymphocytes Relative: 36 % (ref 12.0–46.0)
Lymphs Abs: 2.1 10*3/uL (ref 0.7–4.0)
MCHC: 32.2 g/dL (ref 30.0–36.0)
MCV: 82 fl (ref 78.0–100.0)
Monocytes Absolute: 0.5 10*3/uL (ref 0.1–1.0)
Monocytes Relative: 8.8 % (ref 3.0–12.0)
Neutro Abs: 2.9 10*3/uL (ref 1.4–7.7)
Neutrophils Relative %: 49.8 % (ref 43.0–77.0)
Platelets: 301 10*3/uL (ref 150.0–400.0)
RBC: 5.31 Mil/uL — ABNORMAL HIGH (ref 3.87–5.11)
RDW: 13.2 % (ref 11.5–15.5)
WBC: 5.8 10*3/uL (ref 4.0–10.5)

## 2018-04-11 LAB — LIPID PANEL
Cholesterol: 201 mg/dL — ABNORMAL HIGH (ref 0–200)
HDL: 40 mg/dL (ref >39.00)
LDL Cholesterol: 132 mg/dL — ABNORMAL HIGH (ref 0–99)
NonHDL: 160.98
Total CHOL/HDL Ratio: 5
Triglycerides: 145 mg/dL (ref 0.0–149.0)
VLDL: 29 mg/dL (ref 0.0–40.0)

## 2018-04-11 NOTE — Addendum Note (Signed)
Addended by: London SheerFRIZZELL, Elenore Wanninger T on: 04/11/2018 08:22 AM   Modules accepted: Orders

## 2018-04-11 NOTE — Patient Instructions (Signed)
Keratosis pilaris  Signs and symptoms Keratosis pilaris is a harmless skin disorder that causes small, acne-like bumps. Although it isn't serious, keratosis pilaris can be frustrating because it's difficult to treat.  Keratosis pilaris results from a buildup of protein called keratin in the openings of hair follicles in the skin. This produces small, rough patches, usually on the arms and thighs, and can give skin a goose flesh or sandpaper appearance.   They usually don't hurt or itch. Typically, patches are skin colored, but they can, at times, be red and inflamed.  Keratosis pilaris can also appear on the face, where it closely resembles acne. The small size of the bumps and its association with dry, chapped skin distinguish keratosis pilaris from pustular acne. Unlike elsewhere on the body, keratosis pilaris on the face may leave small scars. Though quite common with young children, keratosis pilaris can occur at any age.  It may improve, especially during the summer months, only to later worsen. Dry skin tends to worsen the condition.  Gradually, keratosis pilaris resolves on its own.  Many people are bothered by the goose flesh appearance of keratosis pilaris, but it doesn't have long-term health implications and occurs in otherwise healthy people.  Keratosis pilaris isn't a serious medical condition, and treatment usually isn't necessary.  Treatment No single treatment universally improves keratosis pilaris. But most options, including self-care measures and medicated creams, focus on softening the keratin deposits in the skin.  Self-care Although self-help measures won't cure keratosis pilaris, they may help improve the appearance of your skin. You may find these measures beneficial: . Be gentle when washing your skin. Vigorous scrubbing or removal of the plugs may only irritate your skin and aggravate the condition.  . After washing or bathing, gently pat or blot your skin dry with a towel so  that some moisture remains on the skin.  Marland Kitchen. Apply the moisturizing lotion or lubricating cream while your skin is still moist from bathing. Choose a moisturizer that contains urea or propylene glycol, chemicals that soften dry, rough skin.  Marland Kitchen. Apply an over-the-counter product that contains lactic acid twice daily (ie, Lac-Hydrin 12% lotion). Lactic acid helps remove extra keratin from the surface of the skin.  . Use a humidifier to add moisture to the air inside your home. Low humidity dries out your skin.

## 2018-04-12 LAB — IRON,TIBC AND FERRITIN PANEL
%SAT: 26 % (calc) (ref 16–45)
Ferritin: 16 ng/mL (ref 16–232)
Iron: 91 ug/dL (ref 40–190)
TIBC: 354 mcg/dL (calc) (ref 250–450)

## 2018-06-22 ENCOUNTER — Encounter: Payer: Self-pay | Admitting: Family Medicine

## 2018-06-22 ENCOUNTER — Ambulatory Visit: Payer: BC Managed Care – PPO | Admitting: Family Medicine

## 2018-06-22 VITALS — BP 102/62 | HR 69 | Temp 98.6°F | Ht 62.0 in | Wt 102.4 lb

## 2018-06-22 DIAGNOSIS — R05 Cough: Secondary | ICD-10-CM

## 2018-06-22 DIAGNOSIS — J4521 Mild intermittent asthma with (acute) exacerbation: Secondary | ICD-10-CM

## 2018-06-22 DIAGNOSIS — R059 Cough, unspecified: Secondary | ICD-10-CM

## 2018-06-22 MED ORDER — AZITHROMYCIN 250 MG PO TABS: ORAL_TABLET | ORAL | 0 refills | Status: DC

## 2018-06-22 NOTE — Progress Notes (Signed)
Tonya Higgins is a 48 y.o. female is here for follow up.  History of Present Illness:   HPI: Back from [place] for a few weeks. Had viral illness that resolved. Now with sinus congestion, productive cough, malaise. Intermittent wheeze made better with inhaler at home. No CP, SOB, edema.   There are no preventive care reminders to display for this patient. Depression screen PHQ 2/9 05/17/2017  Decreased Interest 0  Down, Depressed, Hopeless 0  PHQ - 2 Score 0   PMHx, SurgHx, SocialHx, FamHx, Medications, and Allergies were reviewed in the Visit Navigator and updated as appropriate.   Patient Active Problem List   Diagnosis Date Noted  . Low ferritin level, taking oral iron 04/11/2018  . Keratosis pilaris 04/11/2018  . Gastroesophageal reflux disease, controlled with diet and prn Pepcid 03/29/2018  . Mild intermittent asthma 05/20/2017   Social History   Tobacco Use  . Smoking status: Never Smoker  . Smokeless tobacco: Never Used  Substance Use Topics  . Alcohol use: Yes    Comment: occassionally  . Drug use: No   Current Medications and Allergies   .  famotidine (PEPCID) 20 MG tablet, Take 1 tablet (20 mg total) by mouth 2 (two) times daily as needed for heartburn or indigestion., Disp: 30 tablet, Rfl: 2 .  Multiple Vitamins-Minerals (WOMENS ONE DAILY) TABS, Take 1 tablet by mouth daily., Disp: , Rfl:  .  Salicylic Acid-Urea 5-10 % OINT, Apply to affected areas BID, Disp: 30 g, Rfl: 3  No Known Allergies   Review of Systems   Pertinent items are noted in the HPI. Otherwise, a complete ROS is negative.  Vitals   Vitals:   06/22/18 1557  BP: 102/62  Pulse: 69  Temp: 98.6 F (37 C)  TempSrc: Oral  SpO2: 99%  Weight: 102 lb 6.4 oz (46.4 kg)  Height: 5\' 2"  (1.575 m)     Body mass index is 18.73 kg/m.  Physical Exam   Physical Exam Vitals signs and nursing note reviewed.  HENT:     Head: Normocephalic and atraumatic.     Nose:     Right Sinus: Maxillary  sinus tenderness present.     Left Sinus: Maxillary sinus tenderness present.  Eyes:     Pupils: Pupils are equal, round, and reactive to light.  Neck:     Musculoskeletal: Normal range of motion and neck supple.  Cardiovascular:     Rate and Rhythm: Normal rate and regular rhythm.     Heart sounds: Normal heart sounds.  Pulmonary:     Effort: Pulmonary effort is normal.  Abdominal:     Palpations: Abdomen is soft.  Skin:    General: Skin is warm.  Psychiatric:        Behavior: Behavior normal.     Assessment and Plan   Meggin was seen today for cough.  Diagnoses and all orders for this visit:  Mild intermittent asthma with acute exacerbation  Cough -     azithromycin (ZITHROMAX) 250 MG tablet; Take 2 tablets on day one and then one tablet daily x 5days.  Until gone.    . Orders and follow up as documented in EpicCare, reviewed diet, exercise and weight control, cardiovascular risk and specific lipid/LDL goals reviewed, reviewed medications and side effects in detail.  . Reviewed expectations re: course of current medical issues. . Outlined signs and symptoms indicating need for more acute intervention. . Patient verbalized understanding and all questions were answered. . Patient received  an After Visit Summary.  Helane Rima, DO [place], [facility] 06/23/2018

## 2018-06-23 ENCOUNTER — Encounter: Payer: Self-pay | Admitting: Family Medicine

## 2018-07-15 ENCOUNTER — Ambulatory Visit: Payer: BC Managed Care – PPO | Admitting: Family Medicine

## 2018-09-05 ENCOUNTER — Encounter: Payer: BC Managed Care – PPO | Admitting: Family Medicine

## 2018-09-06 ENCOUNTER — Encounter: Payer: BC Managed Care – PPO | Admitting: Family Medicine

## 2018-12-26 ENCOUNTER — Encounter: Payer: BC Managed Care – PPO | Admitting: Family Medicine

## 2019-02-03 ENCOUNTER — Other Ambulatory Visit: Payer: Self-pay

## 2019-02-03 ENCOUNTER — Ambulatory Visit (INDEPENDENT_AMBULATORY_CARE_PROVIDER_SITE_OTHER): Payer: BC Managed Care – PPO

## 2019-02-03 DIAGNOSIS — Z23 Encounter for immunization: Secondary | ICD-10-CM

## 2019-05-09 ENCOUNTER — Ambulatory Visit: Payer: Self-pay | Admitting: *Deleted

## 2019-05-09 NOTE — Telephone Encounter (Signed)
Noted.  Patient scheduled for appt w/Dr. Jonni Sanger 12/30 @ 1:40 pm

## 2019-05-09 NOTE — Telephone Encounter (Signed)
See note

## 2019-05-09 NOTE — Telephone Encounter (Signed)
FYI-triage notes.  Scheduled for OV with you on 12/30 @ 1:40pm

## 2019-05-09 NOTE — Telephone Encounter (Signed)
Pt called in c/o chest discomfort from "heartburn".    She takes Pepcid but it does not seem to be helping as much as it use to.   She has been burping and at night can feel the reflux of acid to the point she's having a hard time sleeping.      See triage notes.  I went over the care advice with her   She is doing most of it already.     I warm transferred her call into the [place] office to [place] to be scheduled.  I sent my notes to the office.   Reason for Disposition . [1] Patient says chest pain feels exactly the same as previously diagnosed "heartburn" AND [2] describes burning in chest AND [3] accompanying sour taste in mouth  Answer Assessment - Initial Assessment Questions 1. LOCATION: "Where does it hurt?"       I think it's heartburn.   I'm having gas.   I couldn't sleep because of it.   2. RADIATION: "Does the pain go anywhere else?" (e.g., into neck, jaw, arms, back)     No 3. ONSET: "When did the chest pain begin?" (Minutes, hours or days)      About 3 days ago 4. PATTERN "Does the pain come and go, or has it been constant since it started?"  "Does it get worse with exertion?"      Comes and goes.   I'm taking my antiacid but it's not helping. 5. DURATION: "How long does it last" (e.g., seconds, minutes, hours)     Intermittent and at night. 6. SEVERITY: "How bad is the pain?"  (e.g., Scale 1-10; mild, moderate, or severe)    - MILD (1-3): doesn't interfere with normal activities     - MODERATE (4-7): interferes with normal activities or awakens from sleep    - SEVERE (8-10): excruciating pain, unable to do any normal activities       7 on pain scale 7. CARDIAC RISK FACTORS: "Do you have any history of heart problems or risk factors for heart disease?" (e.g., angina, prior heart attack; diabetes, high blood pressure, high cholesterol, smoker, or strong family history of heart disease)     No heart history 8. PULMONARY RISK FACTORS: "Do you have any history of  lung disease?"  (e.g., blood clots in lung, asthma, emphysema, birth control pills)     No 9. CAUSE: "What do you think is causing the chest pain?"     heartburn 10. OTHER SYMPTOMS: "Do you have any other symptoms?" (e.g., dizziness, nausea, vomiting, sweating, fever, difficulty breathing, cough)       No dizziness, vomiting, or diarrhea.    Feel tired. 11. PREGNANCY: "Is there any chance you are pregnant?" "When was your last menstrual period?"       No  Protocols used: CHEST PAIN-A-AH

## 2019-05-10 ENCOUNTER — Encounter: Payer: Self-pay | Admitting: Family Medicine

## 2019-05-10 ENCOUNTER — Ambulatory Visit (INDEPENDENT_AMBULATORY_CARE_PROVIDER_SITE_OTHER): Payer: BC Managed Care – PPO | Admitting: Family Medicine

## 2019-05-10 VITALS — BP 116/62 | HR 81 | Temp 98.3°F | Ht 62.0 in | Wt 99.6 lb

## 2019-05-10 DIAGNOSIS — K219 Gastro-esophageal reflux disease without esophagitis: Secondary | ICD-10-CM

## 2019-05-10 MED ORDER — PANTOPRAZOLE SODIUM 20 MG PO TBEC: DELAYED_RELEASE_TABLET | ORAL | 3 refills | Status: DC

## 2019-05-10 NOTE — Patient Instructions (Addendum)
Please return in 3 months for your annual complete physical; please come fasting.   It was a pleasure meeting you today! Thank you for choosing [place] to meet your healthcare needs! I truly look forward to working with you. If you have any questions or concerns, please send me a message via Mychart or call the office at [phone].   Gastroesophageal Reflux Disease, Adult Gastroesophageal reflux (GER) happens when acid from the stomach flows up into the tube that connects the mouth and the stomach (esophagus). Normally, food travels down the esophagus and stays in the stomach to be digested. However, when a person has GER, food and stomach acid sometimes move back up into the esophagus. If this becomes a more serious problem, the person may be diagnosed with a disease called gastroesophageal reflux disease (GERD). GERD occurs when the reflux:  Happens often.  Causes frequent or severe symptoms.  Causes problems such as damage to the esophagus. When stomach acid comes in contact with the esophagus, the acid may cause soreness (inflammation) in the esophagus. Over time, GERD may create small holes (ulcers) in the lining of the esophagus. What are the causes? This condition is caused by a problem with the muscle between the esophagus and the stomach (lower esophageal sphincter, or LES). Normally, the LES muscle closes after food passes through the esophagus to the stomach. When the LES is weakened or abnormal, it does not close properly, and that allows food and stomach acid to go back up into the esophagus. The LES can be weakened by certain dietary substances, medicines, and medical conditions, including:  Tobacco use.  Pregnancy.  Having a hiatal hernia.  Alcohol use.  Certain foods and beverages, such as coffee, chocolate, onions, and peppermint. What increases the risk? You are more likely to develop this condition if you:  Have an increased body weight.  Have a connective tissue  disorder.  Use NSAID medicines. What are the signs or symptoms? Symptoms of this condition include:  Heartburn.  Difficult or painful swallowing.  The feeling of having a lump in the throat.  Abitter taste in the mouth.  Bad breath.  Having a large amount of saliva.  Having an upset or bloated stomach.  Belching.  Chest pain. Different conditions can cause chest pain. Make sure you see your health care provider if you experience chest pain.  Shortness of breath or wheezing.  Ongoing (chronic) cough or a night-time cough.  Wearing away of tooth enamel.  Weight loss. How is this diagnosed? Your health care provider will take a medical history and perform a physical exam. To determine if you have mild or severe GERD, your health care provider may also monitor how you respond to treatment. You may also have tests, including:  A test to examine your stomach and esophagus with a small camera (endoscopy).  A test thatmeasures the acidity level in your esophagus.  A test thatmeasures how much pressure is on your esophagus.  A barium swallow or modified barium swallow test to show the shape, size, and functioning of your esophagus. How is this treated? The goal of treatment is to help relieve your symptoms and to prevent complications. Treatment for this condition may vary depending on how severe your symptoms are. Your health care provider may recommend:  Changes to your diet.  Medicine.  Surgery. Follow these instructions at home: Eating and drinking   Follow a diet as recommended by your health care provider. This may involve avoiding foods and drinks such  as: ? Coffee and tea (with or without caffeine). ? Drinks that containalcohol. ? Energy drinks and sports drinks. ? Carbonated drinks or sodas. ? Chocolate and cocoa. ? Peppermint and mint flavorings. ? Garlic and onions. ? Horseradish. ? Spicy and acidic foods, including peppers, chili powder, curry  powder, vinegar, hot sauces, and barbecue sauce. ? Citrus fruit juices and citrus fruits, such as oranges, lemons, and limes. ? Tomato-based foods, such as red sauce, chili, salsa, and pizza with red sauce. ? Fried and fatty foods, such as donuts, french fries, potato chips, and high-fat dressings. ? High-fat meats, such as hot dogs and fatty cuts of red and white meats, such as rib eye steak, sausage, ham, and bacon. ? High-fat dairy items, such as whole milk, butter, and cream cheese.  Eat small, frequent meals instead of large meals.  Avoid drinking large amounts of liquid with your meals.  Avoid eating meals during the 2-3 hours before bedtime.  Avoid lying down right after you eat.  Do not exercise right after you eat. Lifestyle   Do not use any products that contain nicotine or tobacco, such as cigarettes, e-cigarettes, and chewing tobacco. If you need help quitting, ask your health care provider.  Try to reduce your stress by using methods such as yoga or meditation. If you need help reducing stress, ask your health care provider.  If you are overweight, reduce your weight to an amount that is healthy for you. Ask your health care provider for guidance about a safe weight loss goal. General instructions  Pay attention to any changes in your symptoms.  Take over-the-counter and prescription medicines only as told by your health care provider. Do not take aspirin, ibuprofen, or other NSAIDs unless your health care provider told you to do so.  Wear loose-fitting clothing. Do not wear anything tight around your waist that causes pressure on your abdomen.  Raise (elevate) the head of your bed about 6 inches (15 cm).  Avoid bending over if this makes your symptoms worse.  Keep all follow-up visits as told by your health care provider. This is important. Contact a health care provider if:  You have: ? New symptoms. ? Unexplained weight loss. ? Difficulty swallowing or it  hurts to swallow. ? Wheezing or a persistent cough. ? A hoarse voice.  Your symptoms do not improve with treatment. Get help right away if you:  Have pain in your arms, neck, jaw, teeth, or back.  Feel sweaty, dizzy, or light-headed.  Have chest pain or shortness of breath.  Vomit and your vomit looks like blood or coffee grounds.  Faint.  Have stool that is bloody or black.  Cannot swallow, drink, or eat. Summary  Gastroesophageal reflux happens when acid from the stomach flows up into the esophagus. GERD is a disease in which the reflux happens often, causes frequent or severe symptoms, or causes problems such as damage to the esophagus.  Treatment for this condition may vary depending on how severe your symptoms are. Your health care provider may recommend diet and lifestyle changes, medicine, or surgery.  Contact a health care provider if you have new or worsening symptoms.  Take over-the-counter and prescription medicines only as told by your health care provider. Do not take aspirin, ibuprofen, or other NSAIDs unless your health care provider told you to do so.  Keep all follow-up visits as told by your health care provider. This is important. This information is not intended to replace advice given to  you by your health care provider. Make sure you discuss any questions you have with your health care provider. Document Released: 02/04/2005 Document Revised: 11/03/2017 Document Reviewed: 11/03/2017 Elsevier Patient Education  2020 [employer].

## 2019-05-10 NOTE — Progress Notes (Signed)
Subjective  CC:  Chief Complaint  Patient presents with  . Chest discomfort    heartburn    HPI: Tonya Higgins is a 48 y.o. female who presents to [facility] at [place] today to establish care with me as a new patient.  New pt to me. I have reviewed chart and old records including ER visits, EKG, pulm consult and prior PCP notes and labs and treatments.  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 Has done well on H2 blockers and short term PPI until about 3-4 weeks ago. Actually stopped her meds in the summer due to lack of symptoms. However, now with substernal burning pain, worse over the last two days. Admits to increased stress due to recent covid surge. No nausea, vomiting or change in BMs. Has had mild decrease in appetite. Restarted pepcid 3 weeks ago with some improvement in pain. Would like to restart protonix. No weight loss. Worries about cardiac disease. Has no CV risk factors.   HM: due cpe with labs. Reports normal pap in 2018 with [facility].   Assessment  1. Gastroesophageal reflux disease, unspecified whether esophagitis present      Plan   GERD: reassured. No sxs of CAD. Restart high dose PPI x 2 weeks. Then decrease back to 20mg  daily. Fu with me if NOT improving.   Due cpe  Counseling regarding decreasing stress reactions. Monitor mood. + depression screen but some +s are due to GERD sxs. Will recheck in 3 months.   Follow up:  Return in about 3 months (around 08/08/2019) for complete physical. No orders of the defined types were placed in this encounter.  Meds ordered this encounter  Medications  . pantoprazole (PROTONIX) 20 MG tablet    Sig: Take 2 tablets (40 mg total) by mouth daily for 14 days, THEN 1 tablet (20 mg total) daily.    Dispense:  90 tablet    Refill:  3       Depression screen [facility] 2/9 05/10/2019 05/17/2017  Decreased Interest 0 0  Down, Depressed, Hopeless 0 0  PHQ - 2 Score 0 0  Altered sleeping 3 -  Tired, decreased energy 1 -  Change in appetite 1 -  Feeling bad or failure about yourself  3 -  Trouble concentrating 1 -  Moving slowly or fidgety/restless 0 -  Suicidal thoughts 0 -  PHQ-9 Score 9 -  Difficult doing work/chores Somewhat difficult -    We updated and reviewed the patient's past history in detail and it is documented below.  Patient Active Problem List   Diagnosis Date Noted  . Low ferritin level, taking oral iron 04/11/2018  . Keratosis pilaris 04/11/2018  . Gastroesophageal reflux disease, controlled with diet and prn Pepcid 03/29/2018  . Mild intermittent asthma 05/20/2017    Spirometry 05/20/2017  Not physiologic  - Allergy profile 05/20/2017 >  Eos 0.2 /  IgE 72  RAST Pos trees  - 05/20/2017  After extensive coaching inhaler device  effectiveness =    50% try symb 80 2bid      Health Maintenance  Topic Date Due  . PAP SMEAR-Modifier  07/09/2019 (Originally 10/26/1991)  . TETANUS/TDAP  05/11/2020  . INFLUENZA VACCINE  Completed  . HIV Screening  Discontinued   Immunization History  Administered Date(s) Administered  .  Influenza,inj,Quad PF,6+ Mos 02/22/2018, 02/03/2019  . Influenza-Unspecified 02/08/2017   Current Meds  Medication Sig  . Multiple Vitamins-Minerals (WOMENS ONE DAILY) TABS Take 1 tablet by mouth daily.  . [DISCONTINUED] famotidine (PEPCID) 20 MG tablet Take 1 tablet (20 mg total) by mouth 2 (two) times daily as needed for heartburn or indigestion.  . [DISCONTINUED] pantoprazole (PROTONIX) 40 MG tablet Take 40 mg by mouth daily.    Allergies: Patient has No Known Allergies. Past Medical History Patient  has a past medical history of Asthma and GERD (gastroesophageal reflux disease). Past Surgical History Patient  has a past surgical history that includes Cesarean section. Family  History: Patient family history includes Asthma in her child; Diabetes in her father. Social History:  Patient  reports that she has never smoked. She has never used smokeless tobacco. She reports current alcohol use. She reports that she does not use drugs.  Review of Systems: Constitutional: negative for fever or malaise Ophthalmic: negative for photophobia, double vision or loss of vision Cardiovascular: negative for chest pain, dyspnea on exertion, or new LE swelling Respiratory: negative for SOB or persistent cough Gastrointestinal: negative for change in bowel habits or melena Genitourinary: negative for dysuria or gross hematuria Musculoskeletal: negative for new gait disturbance or muscular weakness Integumentary: negative for new or persistent rashes Neurological: negative for TIA or stroke symptoms Psychiatric: negative for SI or delusions Allergic/Immunologic: negative for hives  Patient Care Team    Relationship Specialty Notifications Start End  Willow Ora, MD PCP - General Family Medicine  05/10/19   Mansouraty, Netty Starring., MD Consulting Physician Gastroenterology  04/06/18   Nyoka Cowden, MD Consulting Physician Pulmonary Disease  05/10/19     Objective  Vitals: BP 116/62 (BP Location: Right Arm, Patient Position: Sitting, Cuff Size: Normal)   Pulse 81   Temp 98.3 F (36.8 C) (Temporal)   Ht 5\' 2"  (1.575 m)   Wt 99 lb 9.6 oz (45.2 kg)   LMP  (LMP Unknown)   SpO2 98%   BMI 18.22 kg/m  General:  Well developed, well nourished, no acute distress  Psych:  Alert and oriented,normal mood and affect HEENT:  Normocephalic, atraumatic, non-icteric sclera, PERRL,  supple neck without adenopathy, mass or thyromegaly Cardiovascular:  RRR without gallop, rub or murmur, nondisplaced PMI Respiratory:  Good breath sounds bilaterally, CTAB with normal respiratory effort Gastrointestinal: normal bowel sounds, soft, mild midepigastric ttp w/o rebound or guarding. No LUQ  or RUQ ttp, no noted masses. No HSM noted Neurologic:    Mental status is normal. Gr Normal gait   Commons side effects, risks, benefits, and alternatives for medications and treatment plan prescribed today were discussed, and the patient expressed understanding of the given instructions. Patient is instructed to call or message via MyChart if he/she has any questions or concerns regarding our treatment plan. No barriers to understanding were identified. We discussed Red Flag symptoms and signs in detail. Patient expressed understanding regarding what to do in case of urgent or emergency type symptoms.   Medication list was reconciled, printed and provided to the patient in AVS. Patient instructions and summary information was reviewed with the patient as documented in the AVS. This note was prepared with assistance of Dragon voice recognition software. Occasional wrong-word or sound-a-like substitutions may have occurred due to the inherent limitations of voice recognition software  This visit occurred during the SARS-CoV-2 public health emergency.  Safety protocols were in place, including screening questions prior to the visit, additional  usage of staff PPE, and extensive cleaning of exam room while observing appropriate contact time as indicated for disinfecting solutions.

## 2019-08-09 ENCOUNTER — Encounter: Payer: BC Managed Care – PPO | Admitting: Family Medicine

## 2019-08-18 ENCOUNTER — Telehealth: Payer: Self-pay | Admitting: Family Medicine

## 2019-08-18 NOTE — Telephone Encounter (Signed)
Called patient to reschedule her physical and the next available is not until 7/1 and patient would like to know if she can have blood work done a few days prior to the appointment,

## 2019-08-18 NOTE — Telephone Encounter (Signed)
Please advise 

## 2019-08-21 NOTE — Telephone Encounter (Signed)
MyChart message sent to patient.

## 2019-08-21 NOTE — Telephone Encounter (Signed)
Please let pt know I will get all of her blood work on the day of her physical.

## 2019-09-06 ENCOUNTER — Encounter: Payer: BC Managed Care – PPO | Admitting: Family Medicine

## 2019-11-09 ENCOUNTER — Other Ambulatory Visit: Payer: Self-pay

## 2019-11-09 ENCOUNTER — Other Ambulatory Visit (HOSPITAL_COMMUNITY)
Admission: RE | Admit: 2019-11-09 | Discharge: 2019-11-09 | Disposition: A | Discharge status: Home / Self Care | Payer: BC Managed Care – PPO | Source: Ambulatory Visit | Attending: Family Medicine | Admitting: Family Medicine

## 2019-11-09 ENCOUNTER — Ambulatory Visit (INDEPENDENT_AMBULATORY_CARE_PROVIDER_SITE_OTHER): Payer: BC Managed Care – PPO | Admitting: Family Medicine

## 2019-11-09 ENCOUNTER — Encounter: Payer: Self-pay | Admitting: Family Medicine

## 2019-11-09 VITALS — BP 108/64 | HR 74 | Temp 98.3°F | Ht 62.0 in | Wt 102.0 lb

## 2019-11-09 DIAGNOSIS — Z0000 Encounter for general adult medical examination without abnormal findings: Secondary | ICD-10-CM

## 2019-11-09 DIAGNOSIS — Z1159 Encounter for screening for other viral diseases: Secondary | ICD-10-CM | POA: Diagnosis not present

## 2019-11-09 DIAGNOSIS — Z1231 Encounter for screening mammogram for malignant neoplasm of breast: Secondary | ICD-10-CM

## 2019-11-09 DIAGNOSIS — Z124 Encounter for screening for malignant neoplasm of cervix: Secondary | ICD-10-CM | POA: Insufficient documentation

## 2019-11-09 DIAGNOSIS — N920 Excessive and frequent menstruation with regular cycle: Secondary | ICD-10-CM

## 2019-11-09 DIAGNOSIS — K219 Gastro-esophageal reflux disease without esophagitis: Secondary | ICD-10-CM | POA: Diagnosis not present

## 2019-11-09 LAB — COMPREHENSIVE METABOLIC PANEL
ALT: 12 U/L (ref 0–35)
AST: 15 U/L (ref 0–37)
Albumin: 4.8 g/dL (ref 3.5–5.2)
Alkaline Phosphatase: 60 U/L (ref 39–117)
BUN: 13 mg/dL (ref 6–23)
CO2: 26 mEq/L (ref 19–32)
Calcium: 9.6 mg/dL (ref 8.4–10.5)
Chloride: 104 mEq/L (ref 96–112)
Creatinine, Ser: 0.79 mg/dL (ref 0.40–1.20)
GFR: 77.34 mL/min (ref >60.00)
Glucose, Bld: 88 mg/dL (ref 70–99)
Potassium: 4.1 mEq/L (ref 3.5–5.1)
Sodium: 137 mEq/L (ref 135–145)
Total Bilirubin: 0.4 mg/dL (ref 0.2–1.2)
Total Protein: 7.5 g/dL (ref 6.0–8.3)

## 2019-11-09 LAB — CBC WITH DIFFERENTIAL/PLATELET
Basophils Absolute: 0 10*3/uL (ref 0.0–0.1)
Basophils Relative: 0.5 % (ref 0.0–3.0)
Eosinophils Absolute: 0.1 10*3/uL (ref 0.0–0.7)
Eosinophils Relative: 1.9 % (ref 0.0–5.0)
HCT: 41.5 % (ref 36.0–46.0)
Hemoglobin: 13.7 g/dL (ref 12.0–15.0)
Lymphocytes Relative: 37 % (ref 12.0–46.0)
Lymphs Abs: 2.5 10*3/uL (ref 0.7–4.0)
MCHC: 33 g/dL (ref 30.0–36.0)
MCV: 81.9 fl (ref 78.0–100.0)
Monocytes Absolute: 0.6 10*3/uL (ref 0.1–1.0)
Monocytes Relative: 8.7 % (ref 3.0–12.0)
Neutro Abs: 3.5 10*3/uL (ref 1.4–7.7)
Neutrophils Relative %: 51.9 % (ref 43.0–77.0)
Platelets: 329 10*3/uL (ref 150.0–400.0)
RBC: 5.06 Mil/uL (ref 3.87–5.11)
RDW: 12.7 % (ref 11.5–15.5)
WBC: 6.8 10*3/uL (ref 4.0–10.5)

## 2019-11-09 LAB — LIPID PANEL
Cholesterol: 177 mg/dL (ref 0–200)
HDL: 32.4 mg/dL — ABNORMAL LOW (ref >39.00)
NonHDL: 144.8
Total CHOL/HDL Ratio: 5
Triglycerides: 224 mg/dL — ABNORMAL HIGH (ref 0.0–149.0)
VLDL: 44.8 mg/dL — ABNORMAL HIGH (ref 0.0–40.0)

## 2019-11-09 LAB — LDL CHOLESTEROL, DIRECT: Direct LDL: 128 mg/dL

## 2019-11-09 NOTE — Progress Notes (Signed)
Subjective  Chief Complaint  Patient presents with  . Annual Exam    HPI: Tonya Higgins is a 49 y.o. female who presents to [facility] at [place] today for a Female Wellness Visit. She also has the concerns and/or needs as listed above in the chief complaint. These will be addressed in addition to the Health Maintenance Visit.   Wellness Visit: annual visit with health maintenance review and exam with Pap   Health maintenance: Fasting for lab work today, due for Pap smear with HPV typing.  Healthy lifestyle Chronic disease f/u and/or acute problem visit: (deemed necessary to be done in addition to the wellness visit):  History of GERD now with as needed symptoms managed by over-the-counter medications.  Review of systems positive for heavy menstrual cycles are regular.  No other perimenopausal symptoms.  Has history of anemia due to heavy menses.  No fatigue.  Menstrual cycles are not painful.  Assessment  1. Annual physical exam   2. Cervical cancer screening   3. Need for hepatitis C screening test   4. Gastroesophageal reflux disease, unspecified whether esophagitis present      Plan  Female Wellness Visit:  Age appropriate Health Maintenance and Prevention measures were discussed with patient. Included topics are cancer screening recommendations, ways to keep healthy (see AVS) including dietary and exercise recommendations, regular eye and dental care, use of seat belts, and avoidance of moderate alcohol use and tobacco use.  Pap smear done today.  BMI: discussed patient's BMI and encouraged positive lifestyle modifications to help get to or maintain a target BMI.  HM needs and immunizations were addressed and ordered. See below for orders. See HM and immunization section for updates.  Routine labs and screening tests ordered including cmp, cbc and lipids where appropriate.  Discussed recommendations regarding Vit D and calcium supplementation (see  AVS)  Chronic disease management visit and/or acute problem visit:  History of GERD, well controlled  Menorrhagia with regular cycle, perimenopausal: Counseling education given.  NSAIDs as needed and monitor.  If persisting or worsening, return for further evaluation and consideration of therapies to control premenopausal and decrease cycles.  Check for anemia  Follow up: Return in about 1 year (around 11/08/2020) for complete physical.  Orders Placed This Encounter  Procedures  . CBC with Differential/Platelet  . Comprehensive metabolic panel  . Lipid panel  . Hepatitis C antibody   No orders of the defined types were placed in this encounter.     Lifestyle: Body mass index is 18.66 kg/m. Wt Readings from Last 3 Encounters:  11/09/19 102 lb (46.3 kg)  05/10/19 99 lb 9.6 oz (45.2 kg)  06/22/18 102 lb 6.4 oz (46.4 kg)     Patient Active Problem List   Diagnosis Date Noted  . Keratosis pilaris 04/11/2018  . Gastroesophageal reflux disease 03/29/2018  . Mild intermittent asthma 05/20/2017    Spirometry 05/20/2017  Not physiologic  - Allergy profile 05/20/2017 >  Eos 0.2 /  IgE 72  RAST Pos trees  - 05/20/2017  After extensive coaching inhaler device  effectiveness =    50% try symb 80 2bid      Health Maintenance  Topic Date Due  . Hepatitis C Screening  Never done  . PAP SMEAR-Modifier  05/12/2019  . INFLUENZA VACCINE  12/10/2019  . TETANUS/TDAP  05/11/2020  . HIV Screening  Discontinued   Immunization History  Administered Date(s) Administered  . Influenza,inj,Quad PF,6+ Mos 02/22/2018, 02/03/2019  . Influenza-Unspecified  02/08/2017   We updated and reviewed the patient's past history in detail and it is documented below. Allergies: Patient has No Known Allergies. Past Medical History Patient  has a past medical history of Asthma and GERD (gastroesophageal reflux disease). Past Surgical History Patient  has a past surgical history that includes Cesarean  section. Family History: Patient family history includes Asthma in her child; Diabetes in her father. Social History:  Patient  reports that she has never smoked. She has never used smokeless tobacco. She reports current alcohol use. She reports that she does not use drugs.  Review of Systems: Constitutional: negative for fever or malaise Ophthalmic: negative for photophobia, double vision or loss of vision Cardiovascular: negative for chest pain, dyspnea on exertion, or new LE swelling Respiratory: negative for SOB or persistent cough Gastrointestinal: negative for abdominal pain, change in bowel habits or melena Genitourinary: negative for dysuria or gross hematuria, no abnormal uterine bleeding or disharge Musculoskeletal: negative for new gait disturbance or muscular weakness Integumentary: negative for new or persistent rashes, no breast lumps Neurological: negative for TIA or stroke symptoms Psychiatric: negative for SI or delusions Allergic/Immunologic: negative for hives  Patient Care Team    Relationship Specialty Notifications Start End  Willow Ora, MD PCP - General Family Medicine  05/10/19   Mansouraty, Netty Starring., MD Consulting Physician Gastroenterology  04/06/18   Nyoka Cowden, MD Consulting Physician Pulmonary Disease  05/10/19     Objective  Vitals: BP 108/64   Pulse 74   Temp 98.3 F (36.8 C) (Temporal)   Ht 5\' 2"  (1.575 m)   Wt 102 lb (46.3 kg)   LMP 10/16/2019   SpO2 99%   BMI 18.66 kg/m  General:  Well developed, well nourished, no acute distress  Psych:  Alert and orientedx3,normal mood and affect HEENT:  Normocephalic, atraumatic, non-icteric sclera,  supple neck without adenopathy, mass or thyromegaly Cardiovascular:  Normal S1, S2, RRR without gallop, rub or murmur Respiratory:  Good breath sounds bilaterally, CTAB with normal respiratory effort Gastrointestinal: normal bowel sounds, soft, non-tender, no noted masses. No HSM MSK: no  deformities, contusions. Joints are without erythema or swelling.  Skin:  Warm, no rashes or suspicious lesions noted Neurologic:    Mental status is normal. CN 2-11 are normal. Gross motor and sensory exams are normal. Normal gait. No tremor Breast Exam: No mass, skin retraction or nipple discharge is appreciated in either breast. No axillary adenopathy. Fibrocystic changes are not noted Pelvic Exam: Normal external genitalia, no vulvar or vaginal lesions present. Clear cervix w/o CMT. Bimanual exam reveals a nontender fundus w/o masses, nl size. No adnexal masses present. No inguinal adenopathy. A PAP smear was performed.    Commons side effects, risks, benefits, and alternatives for medications and treatment plan prescribed today were discussed, and the patient expressed understanding of the given instructions. Patient is instructed to call or message via MyChart if he/she has any questions or concerns regarding our treatment plan. No barriers to understanding were identified. We discussed Red Flag symptoms and signs in detail. Patient expressed understanding regarding what to do in case of urgent or emergency type symptoms.   Medication list was reconciled, printed and provided to the patient in AVS. Patient instructions and summary information was reviewed with the patient as documented in the AVS. This note was prepared with assistance of Dragon voice recognition software. Occasional wrong-word or sound-a-like substitutions may have occurred due to the inherent limitations of voice recognition software  This  visit occurred during the SARS-CoV-2 public health emergency.  Safety protocols were in place, including screening questions prior to the visit, additional usage of staff PPE, and extensive cleaning of exam room while observing appropriate contact time as indicated for disinfecting solutions.

## 2019-11-09 NOTE — Patient Instructions (Addendum)
Please return in 12 months for your annual complete physical; please come fasting.  I will release your lab results to you on your MyChart account with further instructions. Please reply with any questions.   If you have any questions or concerns, please don't hesitate to send me a message via MyChart or call the office at [phone]. Thank you for visiting with [place] today! It's our pleasure caring for you.  I have ordered a mammogram and/or bone density for you as we discussed today: [x]   Mammogram  []   Bone Density  Please call the office checked below to schedule your appointment: Your appointment will at the following location  [x]   [facility]      [address] [place],        [address] East Wing         []   [facility]  [address] [place],  [facility]  Please do these things to maintain good health!   Exercise at least 30-45 minutes a day,  4-5 days a week.   Eat a low-fat diet with lots of fruits and vegetables, up to 7-9 servings per day.  Drink plenty of water daily. Try to drink 8 8oz glasses per day.  Seatbelts can save your life. Always wear your seatbelt.  Place Smoke Detectors on every level of your home and check batteries every year.  Schedule an appointment with an eye doctor for an eye exam every 1-2 years  Safe sex - use condoms to protect yourself from STDs if you could be exposed to these types of infections. Use birth control if you do not want to become pregnant and are sexually active.  Avoid heavy alcohol use. If you drink, keep it to less than 2 drinks/day and not every day.  Health Care Power of Attorney.  Choose someone you trust that could speak for you if you became unable to speak for yourself.  Depression is common in our stressful world.If you're feeling down or losing interest in things you normally enjoy, please come in for a visit.  If anyone is threatening or hurting you,  please get help. Physical or Emotional Violence is never OK.

## 2019-11-10 LAB — CYTOLOGY - PAP
Comment: NEGATIVE
Diagnosis: NEGATIVE
High risk HPV: NEGATIVE

## 2019-11-10 LAB — HEPATITIS C ANTIBODY
Hepatitis C Ab: NONREACTIVE
SIGNAL TO CUT-OFF: 0.01 (ref <1.00)

## 2019-11-15 ENCOUNTER — Ambulatory Visit
Admission: RE | Admit: 2019-11-15 | Discharge: 2019-11-15 | Disposition: A | Discharge status: Home / Self Care | Payer: BC Managed Care – PPO | Source: Ambulatory Visit | Attending: Family Medicine | Admitting: Family Medicine

## 2019-11-15 ENCOUNTER — Other Ambulatory Visit: Payer: Self-pay

## 2019-11-15 DIAGNOSIS — N920 Excessive and frequent menstruation with regular cycle: Secondary | ICD-10-CM

## 2020-04-26 ENCOUNTER — Encounter: Payer: Self-pay | Admitting: Physician Assistant

## 2020-04-26 ENCOUNTER — Ambulatory Visit: Payer: BC Managed Care – PPO | Admitting: Physician Assistant

## 2020-04-26 ENCOUNTER — Other Ambulatory Visit: Payer: Self-pay

## 2020-04-26 VITALS — BP 104/68 | HR 70 | Temp 98.4°F | Ht 62.0 in | Wt 104.0 lb

## 2020-04-26 DIAGNOSIS — M542 Cervicalgia: Secondary | ICD-10-CM | POA: Diagnosis not present

## 2020-04-26 MED ORDER — BACLOFEN 10 MG PO TABS: 10.0000 mg | ORAL_TABLET | Freq: Three times a day (TID) | ORAL | 0 refills | Status: DC | PRN

## 2020-04-26 NOTE — Progress Notes (Signed)
Tonya Higgins is a 49 y.o. female here for a new problem.  History of Present Illness:   Chief Complaint  Patient presents with  . Neck Pain    x 2 weeks, left side of neck and radiates down left arm. Tried advil with no relief    HPI    L-sided neck pain Two weeks ago patient developed L sided-neck pain. Pain does not radiate to jaw. It is mostly in her L shoulder but can cause pain to her L arm. She is L handed. Denies any inciting event. Pain starts in her neck and travels down her arm. Keeps her up at night. Cannot drive, work on her computer due to pain. Has tried to change the way she is sitting, tried changing her pillow.   Advil gives her heartburn, but did provide some relief of symptoms when she took it. Also tried heat and topical pain-relief ointment.  She has been running on treadmill with no issues, no chest pain or shortness of breath with running.   Denies: changes in vision, slurred speech, n/v, malaise  Past Medical History:  Diagnosis Date  . Asthma   . GERD (gastroesophageal reflux disease)      Social History   Tobacco Use  . Smoking status: Never Smoker  . Smokeless tobacco: Never Used  Vaping Use  . Vaping Use: Never used  Substance Use Topics  . Alcohol use: Yes    Comment: occassionally  . Drug use: No    Past Surgical History:  Procedure Laterality Date  . CESAREAN SECTION     2006 and 2012    Family History  Problem Relation Age of Onset  . Diabetes Father   . Asthma Child   . Breast cancer Neg Hx   . Colon cancer Neg Hx   . Esophageal cancer Neg Hx   . Stomach cancer Neg Hx   . Inflammatory bowel disease Neg Hx   . Liver disease Neg Hx   . Pancreatic cancer Neg Hx   . Rectal cancer Neg Hx     No Known Allergies  Current Medications:   Current Outpatient Medications:  .  baclofen (LIORESAL) 10 MG tablet, Take 1 tablet (10 mg total) by mouth 3 (three) times daily as needed for muscle spasms., Disp: 30 each, Rfl: 0 .   Multiple Vitamins-Minerals (WOMENS ONE DAILY) TABS, Take 1 tablet by mouth daily., Disp: , Rfl:    Review of Systems:   ROS Negative unless otherwise specified per HPI.  Vitals:   Vitals:   04/26/20 1131  BP: 104/68  Pulse: 70  Temp: 98.4 F (36.9 C)  TempSrc: Temporal  SpO2: 100%  Weight: 104 lb (47.2 kg)  Height: 5\' 2"  (1.575 m)     Body mass index is 19.02 kg/m.  Physical Exam:   Physical Exam Vitals and nursing note reviewed.  Constitutional:      General: She is not in acute distress.    Appearance: She is well-developed. She is not ill-appearing, toxic-appearing or sickly-appearing.  Cardiovascular:     Rate and Rhythm: Normal rate and regular rhythm.     Pulses: Normal pulses.     Heart sounds: Normal heart sounds, S1 normal and S2 normal.     Comments: No LE edema Pulmonary:     Effort: Pulmonary effort is normal.     Breath sounds: Normal breath sounds.  Musculoskeletal:     Comments: Significant TTP to L paraspinal cervical/trapezius area  Slight decreased ROM 2/2  pain with flexion/extension, lateral side bends, rotation.  No bony tenderness. No evidence of erythema, rash or ecchymosis.    Skin:    General: Skin is warm, dry and intact.  Neurological:     Mental Status: She is alert.     GCS: GCS eye subscore is 4. GCS verbal subscore is 5. GCS motor subscore is 6.  Psychiatric:        Mood and Affect: Mood and affect normal.        Speech: Speech normal.        Behavior: Behavior normal. Behavior is cooperative.      Assessment and Plan:   Calianna was seen today for neck pain.  Diagnoses and all orders for this visit:  Cervical pain No red flags on exam. Able to do intense cardio on treadmill without worsening symptoms or chest pain, SOB. Suspect possible muscle sprain. Will treat conservatively with oral ibuprofen (and instructed her to take her daily heartburn medication.) Also trial baclofen prn and may trial topical voltaren. If  worsening or no improvement, recommend follow-up in our office. Consider PT for therapy and/or dry needling. Consider imaging if indicated as well. Reviewed cardiac precautions that would warrant ER evaluation.  Other orders -     baclofen (LIORESAL) 10 MG tablet; Take 1 tablet (10 mg total) by mouth 3 (three) times daily as needed for muscle spasms.  Jarold Motto, PA-C

## 2020-04-26 NOTE — Patient Instructions (Signed)
It was great to see you!  Trial oral ibuprofen (take the antacid with this as well.)  Trial baclofen (muscle relaxer) -- I have sent this in.  Consider topical voltaren   Voltaren Gel is available over the counter.   You are to apply this gel to your injured body part twice daily (morning and evening).   A little goes a long way so you can use about a pea-sized amount for each area.  ? Spread this small amount over the area into a thin film and let it dry.  ? Be sure that you do not rub the gel into your skin for more than 10 or 15 seconds otherwise it can irritate you skin.   ? Once you apply the gel, please do not put any other lotion or clothing in contact with that area for 30 minutes to allow the gel to absorb into your skin.   Some people are sensitive to the medication and can develop a sunburn-like rash.  If you have only mild symptoms it is okay to continue to use the medication but if you have any breakdown of your skin you should discontinue its use and please let us know.      If no improvement or any worsening in two weeks, please let me know.  Cervical Strain and Sprain Rehab  Posture and body mechanics Body mechanics refers to the movements and positions of your body while you do your daily activities. Posture is part of body mechanics. Good posture and healthy body mechanics can help to relieve stress in your body's tissues and joints. Good posture means that your spine is in its natural S-curve position (your spine is neutral), your shoulders are pulled back slightly, and your head is not tipped forward. The following are general guidelines for applying improved posture and body mechanics to your everyday activities. Sitting  1. When sitting, keep your spine neutral and keep your feet flat on the floor. Use a footrest, if necessary, and keep your thighs parallel to the floor. Avoid rounding your shoulders, and avoid tilting your head forward. 2. When working at a desk  or a computer, keep your desk at a height where your hands are slightly lower than your elbows. Slide your chair under your desk so you are close enough to maintain good posture. 3. When working at a computer, place your monitor at a height where you are looking straight ahead and you do not have to tilt your head forward or downward to look at the screen. Standing   When standing, keep your spine neutral and keep your feet about hip-width apart. Keep a slight bend in your knees. Your ears, shoulders, and hips should line up.  When you do a task in which you stand in one place for a long time, place one foot up on a stable object that is 2-4 inches (5-10 cm) high, such as a footstool. This helps keep your spine neutral. Resting When lying down and resting, avoid positions that are most painful for you. Try to support your neck in a neutral position. You can use a contour pillow or a small rolled-up towel. Your pillow should support your neck but not push on it. This information is not intended to replace advice given to you by your health care provider. Make sure you discuss any questions you have with your health care provider. Document Revised: 08/17/2018 Document Reviewed: 01/26/2018 Elsevier Patient Education  2020 [employer].

## 2020-05-28 ENCOUNTER — Telehealth: Payer: Self-pay

## 2020-05-28 NOTE — Telephone Encounter (Signed)
FYI

## 2020-05-28 NOTE — Telephone Encounter (Signed)
Nurse Assessment Nurse: Colon Branch, RN, Irving Burton Date/Time Lamount Cohen Time): 05/24/2020 4:54:01 PM Confirm and document reason for call. If symptomatic, describe symptoms. ---Caller states her R pinky finger is slightly tingly since yesterday. Tingling improved this morning but has returned. RH slightly swollen and R wrist to R elbow is slightly swollen. Was seen 3-4 weeks ago for some L sided neck pain. Still has occasional neck pain. No pain anywhere in body at present. Does the patient have any new or worsening symptoms? ---Yes Will a triage be completed? ---Yes Related visit to physician within the last 2 weeks? ---No Does the PT have any chronic conditions? (i.e. diabetes, asthma, this includes High risk factors for pregnancy, etc.) ---No Is the patient pregnant or possibly pregnant? (Ask all females between the ages of 61-55) ---No Is this a behavioral health or substance abuse call? ---No Guidelines Guideline Title Affirmed Question Affirmed Notes Nurse Date/Time (Eastern Time) Neurologic Deficit [1] Tingling (e.g., pins and needles) of the face, arm / hand, or leg / foot on one side of the body AND [2] present now (Exceptions: chronic/ recurrent symptom lasting > 4 weeks or tingling from known cause, such Daymon Larsen 05/24/2020 4:56:52 PM PLEASE NOTE: All timestamps contained within this report are represented as [place] Standard Time. CONFIDENTIALTY NOTICE: This fax transmission is intended only for the addressee. It contains information that is legally privileged, confidential or otherwise protected from use or disclosure. If you are not the intended recipient, you are strictly prohibited from reviewing, disclosing, copying using or disseminating any of this information or taking any action in reliance on or regarding this information. If you have received this fax in error, please notify us immediately by telephone so that we can arrange for its return to [place]. Phone:  [phone], Toll-Free: [phone], Fax: [phone] Page: 2 of 2 Call Id: [id number] Guidelines Guideline Title Affirmed Question Affirmed Notes Nurse Date/Time Lamount Cohen Time) as: bumped elbow, carpal tunnel syndrome, pinched nerve, frostbite) Disp. Time Lamount Cohen Time) Disposition Final User 05/24/2020 4:51:20 PM Send to Urgent [place], April 05/24/2020 5:03:28 PM See HCP within 4 Hours (or PCP triage) Yes Colon Branch, RN, Judie Bonus Disagree/Comply Disagree Caller Understands Yes PreDisposition Did not know what to do Care Advice Given Per Guideline SEE HCP (OR PCP TRIAGE) WITHIN 4 HOURS: * IF OFFICE WILL BE CLOSED AND NO PCP (PRIMARY CARE PROVIDER) SECOND-LEVEL TRIAGE: You need to be seen within the next 3 or 4 hours. A nearby Urgent Care Center [facility]) is often a good source of care. Another choice is to go to the ED. Go sooner if you become worse. * UCC: Some UCCs can manage patients who are stable and have less serious symptoms (e.g., minor illnesses and injuries). The triager must know the [facility] capabilities before sending a patient there. If unsure, call ahead. CALL BACK IF: * You become worse CARE ADVICE given per Neurologic Deficit (Adult) guideline. Referrals GO TO FACILITY UNDECIDE

## 2020-05-29 NOTE — Telephone Encounter (Signed)
Can schedule an appointment with me if she hasn't been seen yet. Thanks.

## 2020-05-29 NOTE — Telephone Encounter (Signed)
Pt is scheduled for 05/30/2020

## 2020-05-29 NOTE — Telephone Encounter (Signed)
Please call to get patient scheduled

## 2020-05-30 ENCOUNTER — Ambulatory Visit (INDEPENDENT_AMBULATORY_CARE_PROVIDER_SITE_OTHER): Payer: BC Managed Care – PPO

## 2020-05-30 ENCOUNTER — Encounter: Payer: Self-pay | Admitting: Family Medicine

## 2020-05-30 ENCOUNTER — Other Ambulatory Visit: Payer: Self-pay

## 2020-05-30 ENCOUNTER — Ambulatory Visit (INDEPENDENT_AMBULATORY_CARE_PROVIDER_SITE_OTHER): Payer: BC Managed Care – PPO | Admitting: Family Medicine

## 2020-05-30 VITALS — BP 100/62 | HR 69 | Temp 98.5°F | Wt 103.4 lb

## 2020-05-30 DIAGNOSIS — M542 Cervicalgia: Secondary | ICD-10-CM

## 2020-05-30 DIAGNOSIS — R202 Paresthesia of skin: Secondary | ICD-10-CM

## 2020-05-30 DIAGNOSIS — M79609 Pain in unspecified limb: Secondary | ICD-10-CM | POA: Diagnosis not present

## 2020-05-30 DIAGNOSIS — G5601 Carpal tunnel syndrome, right upper limb: Secondary | ICD-10-CM

## 2020-05-30 DIAGNOSIS — M778 Other enthesopathies, not elsewhere classified: Secondary | ICD-10-CM | POA: Diagnosis not present

## 2020-05-30 LAB — B12 AND FOLATE PANEL
Folate: >23.6 ng/mL (ref >5.9)
Vitamin B-12: 786 pg/mL (ref 211–911)

## 2020-05-30 LAB — TSH: TSH: 1.27 u[IU]/mL (ref 0.35–4.50)

## 2020-05-30 LAB — SEDIMENTATION RATE: Sed Rate: 26 mm/hr — ABNORMAL HIGH (ref 0–20)

## 2020-05-30 NOTE — Patient Instructions (Signed)
Please return in 2-4 weeks if symptoms persist or worsen.   Take advil, 2 tabs daily with meals for 7-10 days.  Wear a carpal tunnel wrist splint at night for 2 weeks.   If you have any questions or concerns, please don't hesitate to send me a message via MyChart or call the office at [phone]. Thank you for visiting with Tonya Higgins today! It's our pleasure caring for you.   Carpal Tunnel Syndrome  Carpal tunnel syndrome is a condition that causes pain, numbness, and weakness in your hand and fingers. The carpal tunnel is a narrow area located on the palm side of your wrist. Repeated wrist motion or certain diseases may cause swelling within the tunnel. This swelling pinches the main nerve in the wrist. The main nerve in the wrist is called the median nerve. What are the causes? This condition may be caused by:  Repeated and forceful wrist and hand motions.  Wrist injuries.  Arthritis.  A cyst or tumor in the carpal tunnel.  Fluid buildup during pregnancy.  Use of tools that vibrate. Sometimes the cause of this condition is not known. What increases the risk? The following factors may make you more likely to develop this condition:  Having a job that requires you to repeatedly or forcefully move your wrist or hand or requires you to use tools that vibrate. This may include jobs that involve using computers, working on an [employer], or working with power tools such as Radiographer, therapeutic.  Being a woman.  Having certain conditions, such as: ? Diabetes. ? Obesity. ? An underactive thyroid (hypothyroidism). ? Kidney failure. ? Rheumatoid arthritis. What are the signs or symptoms? Symptoms of this condition include:  A tingling feeling in your fingers, especially in your thumb, index, and middle fingers.  Tingling or numbness in your hand.  An aching feeling in your entire arm, especially when your wrist and elbow are bent for a long time.  Wrist pain that goes up your arm  to your shoulder.  Pain that goes down into your palm or fingers.  A weak feeling in your hands. You may have trouble grabbing and holding items. Your symptoms may feel worse during the night. How is this diagnosed? This condition is diagnosed with a medical history and physical exam. You may also have tests, including:  Electromyogram (EMG). This test measures electrical signals sent by your nerves into the muscles.  Nerve conduction study. This test measures how well electrical signals pass through your nerves.  Imaging tests, such as X-rays, ultrasound, and MRI. These tests check for possible causes of your condition. How is this treated? This condition may be treated with:  Lifestyle changes. It is important to stop or change the activity that caused your condition.  Doing exercise and activities to strengthen and stretch your muscles and tendons (physical therapy).  Making lifestyle changes to help with your condition and learning how to do your daily activities safely (occupational therapy).  Medicines for pain and inflammation. This may include medicine that is injected into your wrist.  A wrist splint or brace.  Surgery. Follow these instructions at home: If you have a splint or brace:  Wear the splint or brace as told by your health care provider. Remove it only as told by your health care provider.  Loosen the splint or brace if your fingers tingle, become numb, or turn cold and blue.  Keep the splint or brace clean.  If the splint or brace is  not waterproof: ? Do not let it get wet. ? Cover it with a watertight covering when you take a bath or shower. Managing pain, stiffness, and swelling If directed, put ice on the painful area. To do this:  If you have a removeable splint or brace, remove it as told by your health care provider.  Put ice in a plastic bag.  Place a towel between your skin and the bag or between the splint or brace and the bag.  Leave the  ice on for 20 minutes, 2-3 times a day. Do not fall asleep with the cold pack on your skin.  Remove the ice if your skin turns bright red. This is very important. If you cannot feel pain, heat, or cold, you have a greater risk of damage to the area. Move your fingers often to reduce stiffness and swelling.   General instructions  Take over-the-counter and prescription medicines only as told by your health care provider.  Rest your wrist and hand from any activity that may be causing your pain. If your condition is work related, talk with your employer about changes that can be made, such as getting a wrist pad to use while typing.  Do any exercises as told by your health care provider, physical therapist, or occupational therapist.  Keep all follow-up visits. This is important. Contact a health care provider if:  You have new symptoms.  Your pain is not controlled with medicines.  Your symptoms get worse. Get help right away if:  You have severe numbness or tingling in your wrist or hand. Summary  Carpal tunnel syndrome is a condition that causes pain, numbness, and weakness in your hand and fingers.  It is usually caused by repeated wrist motions.  Lifestyle changes and medicines are used to treat carpal tunnel syndrome. Surgery may be recommended.  Follow your health care provider's instructions about wearing a splint, resting from activity, keeping follow-up visits, and calling for help. This information is not intended to replace advice given to you by your health care provider. Make sure you discuss any questions you have with your health care provider. Document Revised: 09/07/2019 Document Reviewed: 09/07/2019 Elsevier Patient Education  2021 [employer].

## 2020-05-30 NOTE — Progress Notes (Signed)
Subjective  CC:   Chief Complaint  Patient presents with  . Hand Pain    Right pinky, tingly sensation started last week on Tuesday - has noticed swelling in her right wrist    HPI: Tonya Higgins is a 50 y.o. female who presents to the office today to address the problems listed above in the chief complaint.  49 year old female with about a week history of intermittent paresthesias in her right fifth finger.  At times she describes numbness and tingling.  The symptoms will last for hours at a time.  They are not provoked by certain movements.  She is also noticed some medial forearm pain with certain movements and mild swelling.  Also describes numbness and tingling that happens at night in her first 4 digits.  She was seen last month for neck pain with symptoms that radiated to the left.  Although symptoms have resolved spontaneously.  She denies shoulder or elbow pain.  She has had no other joint problems.  At times she feels her right grip is weak.  She has had no other neurologic deficits in her lifetime.  She does work on a [employer]im[employer] and feels that this may be why her wrist is sore.  She has not tried any treatments for these problems.   Assessment  1. Paresthesia and pain of right extremity   2. Right carpal tunnel syndrome   3. Tendinitis of extensor tendon of right hand   4. Neck pain      Plan   Paresthesias of the right fifth digit: Unclear etiology.  Could be peripheral neuropathy or could be stemming from the neck.  Check x-rays of the cervical spine given she had neck pain last month.  Will monitor for now.  Check blood work to ensure no vitamin deficiencies or thyroid problems.  If persist, further work-up will be done soon.  Carpal tunnel syndrome: Educated.  Start NSAIDs for 7 to 10 days in response for 2 weeks.  Suspect this is the cause of some of her complaints.  Mild extensor tendinitis of right hand: Ice and NSAIDs.  Should improve.   Follow up: Return in  about 4 weeks (around 06/27/2020) for recheck if needed.  Visit date not found  Orders Placed This Encounter  Procedures  . DG Cervical Spine Complete  . Sedimentation rate  . TSH  . B12 and Folate Panel   No orders of the defined types were placed in this encounter.     I reviewed the patients updated PMH, FH, and SocHx.    Patient Active Problem List   Diagnosis Date Noted  . Keratosis pilaris 04/11/2018  . Gastroesophageal reflux disease 03/29/2018  . Mild intermittent asthma 05/20/2017   Current Meds  Medication Sig  . Multiple Vitamins-Minerals (WOMENS ONE DAILY) TABS Take 1 tablet by mouth daily.    Allergies: Patient has No Known Allergies. Family History: Patient family history includes Asthma in her child; Diabetes in her father. Social History:  Patient  reports that she has never smoked. She has never used smokeless tobacco. She reports current alcohol use. She reports that she does not use drugs.  Review of Systems: Constitutional: Negative for fever malaise or anorexia Cardiovascular: negative for chest pain Respiratory: negative for SOB or persistent cough Gastrointestinal: negative for abdominal pain  Objective  Vitals: BP 100/62   Pulse 69   Temp 98.5 F (36.9 C) (Temporal)   Wt 103 lb 6.4 oz (46.9 kg)   LMP 05/23/2020  SpO2 98%   BMI 18.91 kg/m  General: no acute distress , A&Ox3 Supple neck Right elbow normal exam Right wrist nontender, mild possible swelling at distal forearm with mild pain with resisted extension of wrist.  Positive Phalen sign right.  Normal sensation bilateral hands.  5 out of 5 grip strength bilaterally    Commons side effects, risks, benefits, and alternatives for medications and treatment plan prescribed today were discussed, and the patient expressed understanding of the given instructions. Patient is instructed to call or message via MyChart if he/she has any questions or concerns regarding our treatment plan. No  barriers to understanding were identified. We discussed Red Flag symptoms and signs in detail. Patient expressed understanding regarding what to do in case of urgent or emergency type symptoms.   Medication list was reconciled, printed and provided to the patient in AVS. Patient instructions and summary information was reviewed with the patient as documented in the AVS. This note was prepared with assistance of Dragon voice recognition software. Occasional wrong-word or sound-a-like substitutions may have occurred due to the inherent limitations of voice recognition software  This visit occurred during the SARS-CoV-2 public health emergency.  Safety protocols were in place, including screening questions prior to the visit, additional usage of staff PPE, and extensive cleaning of exam room while observing appropriate contact time as indicated for disinfecting solutions.

## 2020-05-31 NOTE — Progress Notes (Signed)
DJD cervical spine. See note.

## 2020-06-03 ENCOUNTER — Encounter: Payer: Self-pay | Admitting: Family Medicine

## 2020-06-17 ENCOUNTER — Other Ambulatory Visit: Payer: Self-pay

## 2020-06-17 ENCOUNTER — Ambulatory Visit (INDEPENDENT_AMBULATORY_CARE_PROVIDER_SITE_OTHER): Payer: BC Managed Care – PPO | Admitting: Family Medicine

## 2020-06-17 ENCOUNTER — Encounter: Payer: Self-pay | Admitting: Family Medicine

## 2020-06-17 VITALS — BP 110/80 | HR 74 | Temp 98.3°F | Ht 62.0 in | Wt 105.0 lb

## 2020-06-17 DIAGNOSIS — M542 Cervicalgia: Secondary | ICD-10-CM

## 2020-06-17 DIAGNOSIS — M47812 Spondylosis without myelopathy or radiculopathy, cervical region: Secondary | ICD-10-CM | POA: Diagnosis not present

## 2020-06-17 MED ORDER — PREDNISONE 10 MG PO TABS: ORAL_TABLET | ORAL | 0 refills | Status: DC

## 2020-06-17 MED ORDER — CYCLOBENZAPRINE HCL 10 MG PO TABS: 10.0000 mg | ORAL_TABLET | Freq: Every day | ORAL | 0 refills | Status: DC

## 2020-06-17 MED ORDER — TRAMADOL HCL 50 MG PO TABS: 50.0000 mg | ORAL_TABLET | Freq: Three times a day (TID) | ORAL | 0 refills | Status: AC | PRN

## 2020-06-17 NOTE — Progress Notes (Signed)
Subjective  CC:  Chief Complaint  Patient presents with  . Neck Pain    On going neck pain from about a month ago. They pain is a constant pain, She states that she's unable to sleep anymore.     HPI: Tonya Higgins is a 50 y.o. female who presents to the office today to address the problems listed above in the chief complaint.  50 year old here for persistent neck pain.  Evaluated a few weeks ago for right pinky paresthesias which have since resolved.  She was treated for carpal tunnel.  However, now with daily and nightly neck pain that persists.  She reports this has been a problem off and on over the years.  X-rays reviewed from 2 weeks ago show moderate to severe osteoarthritic changes.  She denies radicular symptoms or weakness at this time.  However over the years, she has had numbness or tingling down the arm intermittently.  She reports her mother had early osteoarthritis.  She has had no neck trauma.  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  She has pain in her upper back as well.  No shoulder pain.  Some low back pain.  She takes over-the-counter Tylenol and Advil without any relief.  At this point she is not sleeping well.  She is having difficulty doing normal activities like cooking and cleaning because of the pain.   Assessment  1. Spondylosis of cervical region without myelopathy or radiculopathy   2. Neck pain      Plan   Neck pain due to osteoarthritis: Education given.  Trial of prednisone, tramadol and Flexeril.  X-rays did show loss of lordosis and she has significant muscle tenderness.  Consider a massage, heat and will order physical therapy to see if we can get her symptoms to calm down.  If not improving, recommend orthopedic referral and/or MRI.  Patient understands and agrees  Follow up: 4 weeks for recheck Visit date not found  Orders Placed This Encounter  Procedures  . Ambulatory referral to Physical  Therapy   Meds ordered this encounter  Medications  . traMADol (ULTRAM) 50 MG tablet    Sig: Take 1 tablet (50 mg total) by mouth every 8 (eight) hours as needed for up to 5 days.    Dispense:  15 tablet    Refill:  0  . cyclobenzaprine (FLEXERIL) 10 MG tablet    Sig: Take 1 tablet (10 mg total) by mouth at bedtime.    Dispense:  30 tablet    Refill:  0  . predniSONE (DELTASONE) 10 MG tablet    Sig: Take 4 tabs qd x 2 days, 3 qd x 2 days, 2 qd x 2d, 1qd x 3 days    Dispense:  21 tablet    Refill:  0      I reviewed the patients updated PMH, FH, and SocHx.    Patient Active Problem List   Diagnosis Date Noted  . Keratosis pilaris 04/11/2018  . Gastroesophageal reflux disease 03/29/2018  . Mild intermittent asthma 05/20/2017   Current Meds  Medication Sig  . cyclobenzaprine (FLEXERIL) 10 MG tablet Take 1 tablet (10 mg total) by mouth at bedtime.  . Multiple Vitamins-Minerals (WOMENS ONE DAILY) TABS Take 1 tablet by mouth daily.  . predniSONE (DELTASONE) 10 MG tablet Take 4 tabs qd x 2 days, 3 qd x 2 days, 2 qd x 2d, 1qd x 3 days  .  traMADol (ULTRAM) 50 MG tablet Take 1 tablet (50 mg total) by mouth every 8 (eight) hours as needed for up to 5 days.    Allergies: Patient has No Known Allergies. Family History: Patient family history includes Asthma in her child; Diabetes in her father. Social History:  Patient  reports that she has never smoked. She has never used smokeless tobacco. She reports current alcohol use. She reports that she does not use drugs.  Review of Systems: Constitutional: Negative for fever malaise or anorexia Cardiovascular: negative for chest pain Respiratory: negative for SOB or persistent cough Gastrointestinal: negative for abdominal pain  Objective  Vitals: BP 110/80   Pulse 74   Temp 98.3 F (36.8 C) (Temporal)   Ht 5\' 2"  (1.575 m)   Wt 105 lb (47.6 kg)   LMP 05/23/2020   SpO2 99%   BMI 19.20 kg/m  General: no acute distress ,  A&Ox3 HEENT: PEERL, conjunctiva normal, neck is supple, bilateral trap tenderness, lower paracervical tenderness, full range of motion, negative Spurling's   Commons side effects, risks, benefits, and alternatives for medications and treatment plan prescribed today were discussed, and the patient expressed understanding of the given instructions. Patient is instructed to call or message via MyChart if he/she has any questions or concerns regarding our treatment plan. No barriers to understanding were identified. We discussed Red Flag symptoms and signs in detail. Patient expressed understanding regarding what to do in case of urgent or emergency type symptoms.   Medication list was reconciled, printed and provided to the patient in AVS. Patient instructions and summary information was reviewed with the patient as documented in the AVS. This note was prepared with assistance of Dragon voice recognition software. Occasional wrong-word or sound-a-like substitutions may have occurred due to the inherent limitations of voice recognition software  This visit occurred during the SARS-CoV-2 public health emergency.  Safety protocols were in place, including screening questions prior to the visit, additional usage of staff PPE, and extensive cleaning of exam room while observing appropriate contact time as indicated for disinfecting solutions.

## 2020-06-17 NOTE — Patient Instructions (Addendum)
Please return in 4 weeks for recheck.   We will call you to get physical therapy scheduled.  Use the medications as directed.  If you have any questions or concerns, please don't hesitate to send me a message via MyChart or call the office at [phone]. Thank you for visiting with Tonya Higgins today! It's our pleasure caring for you.   Osteoarthritis  Osteoarthritis is a type of arthritis. It refers to joint pain or joint disease. Osteoarthritis affects tissue that covers the ends of bones in joints (cartilage). Cartilage acts as a cushion between the bones and helps them move smoothly. Osteoarthritis occurs when cartilage in the joints gets worn down. Osteoarthritis is sometimes called "wear and tear" arthritis. Osteoarthritis is the most common form of arthritis. It often occurs in older people. It is a condition that gets worse over time. The joints most often affected by this condition are in the fingers, toes, hips, knees, and spine, including the neck and lower back. What are the causes? This condition is caused by the wearing down of cartilage that covers the ends of bones. What increases the risk? The following factors may make you more likely to develop this condition:  Being age 70 or older.  Obesity.  Overuse of joints.  Past injury of a joint.  Past surgery on a joint.  Family history of osteoarthritis. What are the signs or symptoms? The main symptoms of this condition are pain, swelling, and stiffness in the joint. Other symptoms may include:  An enlarged joint.  More pain and further damage caused by small pieces of bone or cartilage that break off and float inside of the joint.  Small deposits of bone (osteophytes) that grow on the edges of the joint.  A grating or scraping feeling inside the joint when you move it.  Popping or creaking sounds when you move.  Difficulty walking or exercising.  An inability to grip items, twist your hand(s), or control the movements  of your hands and fingers. How is this diagnosed? This condition may be diagnosed based on:  Your medical history.  A physical exam.  Your symptoms.  X-rays of the affected joint(s).  Blood tests to rule out other types of arthritis. How is this treated? There is no cure for this condition, but treatment can help control pain and improve joint function. Treatment may include a combination of therapies, such as:  Pain relief techniques, such as: ? Applying heat and cold to the joint. ? Massage. ? A form of talk therapy called cognitive behavioral therapy (CBT). This therapy helps you set goals and follow up on the changes that you make.  Medicines for pain and inflammation. The medicines can be taken by mouth or applied to the skin. They include: ? NSAIDs, such as ibuprofen. ? Prescription medicines. ? Strong anti-inflammatory medicines (corticosteroids). ? Certain nutritional supplements.  A prescribed exercise program. You may work with a physical therapist.  Assistive devices, such as a brace, wrap, splint, specialized glove, or cane.  A weight control plan.  Surgery, such as: ? An osteotomy. This is done to reposition the bones and relieve pain or to remove loose pieces of bone and cartilage. ? Joint replacement surgery. You may need this surgery if you have advanced osteoarthritis. Follow these instructions at home: Activity  Rest your affected joints as told by your health care provider.  Exercise as told by your health care provider. He or she may recommend specific types of exercise, such as: ? Strengthening  exercises. These are done to strengthen the muscles that support joints affected by arthritis. ? Aerobic activities. These are exercises, such as brisk walking or water aerobics, that increase your heart rate. ? Range-of-motion activities. These help your joints move more easily. ? Balance and agility exercises. Managing pain, stiffness, and swelling  If  directed, apply heat to the affected area as often as told by your health care provider. Use the heat source that your health care provider recommends, such as a moist heat pack or a heating pad. ? If you have a removable assistive device, remove it as told by your health care provider. ? Place a towel between your skin and the heat source. If your health care provider tells you to keep the assistive device on while you apply heat, place a towel between the assistive device and the heat source. ? Leave the heat on for 20-30 minutes. ? Remove the heat if your skin turns bright red. This is especially important if you are unable to feel pain, heat, or cold. You may have a greater risk of getting burned.  If directed, put ice on the affected area. To do this: ? If you have a removable assistive device, remove it as told by your health care provider. ? Put ice in a plastic bag. ? Place a towel between your skin and the bag. If your health care provider tells you to keep the assistive device on during icing, place a towel between the assistive device and the bag. ? Leave the ice on for 20 minutes, 2-3 times a day. ? Move your fingers or toes often to reduce stiffness and swelling. ? Raise (elevate) the injured area above the level of your heart while you are sitting or lying down.      General instructions  Take over-the-counter and prescription medicines only as told by your health care provider.  Maintain a healthy weight. Follow instructions from your health care provider for weight control.  Do not use any products that contain nicotine or tobacco, such as cigarettes, e-cigarettes, and chewing tobacco. If you need help quitting, ask your health care provider.  Use assistive devices as told by your health care provider.  Keep all follow-up visits as told by your health care provider. This is important. Where to find more information  [employer] of Arthritis and Musculoskeletal and  Skin Diseases: www.niams.http://www.myers.net/  [employer] on Aging: https://walker.com/  American College of Rheumatology: www.rheumatology.org Contact a health care provider if:  You have redness, swelling, or a feeling of warmth in a joint that gets worse.  You have a fever along with joint or muscle aches.  You develop a rash.  You have trouble doing your normal activities. Get help right away if:  You have pain that gets worse and is not relieved by pain medicine. Summary  Osteoarthritis is a type of arthritis that affects tissue covering the ends of bones in joints (cartilage).  This condition is caused by the wearing down of cartilage that covers the ends of bones.  The main symptom of this condition is pain, swelling, and stiffness in the joint.  There is no cure for this condition, but treatment can help control pain and improve joint function. This information is not intended to replace advice given to you by your health care provider. Make sure you discuss any questions you have with your health care provider. Document Revised: 04/24/2019 Document Reviewed: 04/24/2019 Elsevier Patient Education  2021 [employer].

## 2020-07-01 ENCOUNTER — Encounter: Payer: Self-pay | Admitting: Physical Therapy

## 2020-07-01 ENCOUNTER — Ambulatory Visit: Payer: BC Managed Care – PPO | Admitting: Physical Therapy

## 2020-07-01 ENCOUNTER — Other Ambulatory Visit: Payer: Self-pay

## 2020-07-01 DIAGNOSIS — M542 Cervicalgia: Secondary | ICD-10-CM | POA: Diagnosis not present

## 2020-07-02 ENCOUNTER — Encounter: Payer: Self-pay | Admitting: Physical Therapy

## 2020-07-02 NOTE — Patient Instructions (Signed)
Access Code: [email or web address] URL: [email or web address] Date: 07/02/2020 Prepared by: Sedalia Muta  Exercises Seated Cervical Sidebending Stretch - 2 x daily - 3 reps - 30 hold Standing Backward Shoulder Rolls - 3 x daily - 1 sets - 10 reps Seated Scapular Retraction - 3 x daily - 1 sets - 10 reps

## 2020-07-02 NOTE — Therapy (Signed)
[facility] [address] [place], [place], [address] Phone: [phone]   Fax:  [phone]  Physical Therapy Evaluation  Patient Details  Name: Tonya Higgins MRN: [id number] Date of Birth: 12/22/1970 Referring Provider (PT): Asencion Partridge   Encounter Date: 07/01/2020   PT End of Session - 07/02/20 0821    Visit Number 1    Number of Visits 12    Date for PT Re-Evaluation 08/12/20    Authorization Type BC[employer]    PT Start Time 1018    PT Stop Time 1100    PT Time Calculation (min) 42 min    Activity Tolerance Patient tolerated treatment well    Behavior During Therapy [facility] for tasks assessed/performed           Past Medical History:  Diagnosis Date  . Asthma   . GERD (gastroesophageal reflux disease)     Past Surgical History:  Procedure Laterality Date  . CESAREAN SECTION     2006 and 2012    There were no vitals filed for this visit.    Subjective Assessment - 07/02/20 0816    Subjective Pt states pain into L side of neck, into L UT for 2 mo. States no pain/tingling into UEs. She is L handed. Works as professor at [employer], does a Financial planner work. Reports sitting on couch in poor posture much of the time when shes on computer. Has not had neck pain in the past. Recent x-ray shoes degeneration and small osteophytes.    Limitations Sitting;Reading;Writing;House hold activities;Lifting    Diagnostic tests recent x-ray result:  There is moderate disc space narrowing at C5-6 with slightly milder  disc space narrowing at C4-5. Small anterior osteophytes noted at C5  and C6. There is facet hypertrophy with exit foraminal narrowing at  C4-5 and C5-6 bilaterally    Currently in Pain? Yes    Pain Score 5     Pain Location Neck    Pain Orientation Left    Pain Descriptors / Indicators Aching;Tightness    Pain Type Acute pain    Pain Onset More than a month ago    Pain Frequency Intermittent    Aggravating Factors  computer  work, work duties    Pain Relieving Factors heat              [facility] PT Assessment - 07/02/20 0001      Assessment   Medical Diagnosis Neck Pain    Referring Provider (PT) Asencion Partridge    Hand Dominance Left    Prior Therapy no      Balance Screen   Has the patient fallen in the past 6 months No      Prior Function   Level of Independence Independent      Cognition   Overall Cognitive Status Within Functional Limits for tasks assessed      Posture/Postural Control   Posture Comments mild fwd head, flexible,      ROM / Strength   AROM / PROM / Strength AROM;Strength      AROM   Overall AROM Comments Cervical: WNL, mild pain with L rotation and extension      Strength   Overall Strength Comments UE: 4+/5;  Scapular: 4-/5      Palpation   Palpation comment Tightness and soreness in Bil UTs, L>R, Tenderness at L paraspinals and bil Sub occipitals.   Pain at mid/lower c-spine with PAs,      Special Tests  Other special tests Neg ULTT,                      Objective measurements completed on examination: See above findings.       [facility] Adult PT Treatment/Exercise - 07/02/20 0001      Exercises   Exercises Neck      Neck Exercises: Seated   Shoulder Rolls 15 reps    Other Seated Exercise Scap retract x 15;      Manual Therapy   Manual Therapy Joint mobilization;Soft tissue mobilization;Passive ROM;Manual Traction    Joint Mobilization Cervical, PA mobs    Soft tissue mobilization STM/DTM to bil UT, cervical paraspinals, SOR    Passive ROM manual UT and levator stretches    Manual Traction cervical 10 s x 10      Neck Exercises: Stretches   Upper Trapezius Stretch 2 reps;30 seconds    Upper Trapezius Stretch Limitations bil                  PT Education - 07/02/20 8453    Education Details PT POC, Exam findings, HEP, posture    Person(s) Educated Patient    Methods Explanation;Demonstration;Verbal cues;Handout    Comprehension  Verbalized understanding;Returned demonstration;Verbal cues required;Tactile cues required;Need further instruction            PT Short Term Goals - 07/02/20 1208      PT SHORT TERM GOAL #1   Title Pt to be independent with initial HEP    Time 2    Period Weeks    Status New    Target Date 07/15/20             PT Long Term Goals - 07/02/20 1209      PT LONG TERM GOAL #1   Title Pt to be independent with final HEP    Time 6    Period Weeks    Status New    Target Date 08/12/20      PT LONG TERM GOAL #2   Title Pt to reprot pain in neck to 0-2/10 with work duties    Time 6    Period Weeks    Status New    Target Date 08/12/20      PT LONG TERM GOAL #3   Title Pt to demo improved strength of scapular muscles to be at least 4+/5 for improved pain and posture    Time 6    Period Weeks    Status New    Target Date 08/12/20      PT LONG TERM GOAL #4   Title Pt to demo soft tissue limitations in cervical /thoracic region to be WNL for improved pain.    Time 6    Period Weeks    Status New    Target Date 08/12/20                  Plan - 07/02/20 1211    Clinical Impression Statement Pt presents wtih primary complaint of increased pain in cervical region. Pt with increased muscle tension and pain in L UT and cervical musculature. She has increased pain with ROM as well as with work duties and [employer]. Pt to benefit from education on posture for computer work, as well as posural strengthening. Pt with decreased ability for full functional activities, due to pain, and will benefit from skilled PT to improve.    Examination-Activity Limitations Lift;Reach Overhead;Carry    Examination-Participation Restrictions Cleaning;Meal  Prep;Community Activity;Occupation;Driving    Stability/Clinical Decision Making Stable/Uncomplicated    Clinical Decision Making Low    Rehab Potential Good    PT Frequency 2x / week    PT Duration 6 weeks    PT Treatment/Interventions  ADLs/Self Care Home Management;Cryotherapy;Electrical Stimulation;Ultrasound;Traction;Moist Heat;Iontophoresis 4mg /ml Dexamethasone;Functional mobility training;Therapeutic activities;Therapeutic exercise;Patient/family education;Neuromuscular re-education;Manual techniques;Passive range of motion;Dry needling;Visual/perceptual remediation/compensation;Spinal Manipulations;Joint Manipulations;Taping    PT Home Exercise Plan [email or web address]    Consulted and Agree with Plan of Care Patient           Patient will benefit from skilled therapeutic intervention in order to improve the following deficits and impairments:  Pain,Improper body mechanics,Increased muscle spasms,Decreased activity tolerance,Decreased range of motion,Decreased strength  Visit Diagnosis: Cervicalgia     Problem List Patient Active Problem List   Diagnosis Date Noted  . Keratosis pilaris 04/11/2018  . Gastroesophageal reflux disease 03/29/2018  . Mild intermittent asthma 05/20/2017   07/18/2017, PT, DPT 12:15 PM  07/02/20    [facility] [address] [place], [place], [place] Phone: [phone]   Fax:  [phone]  Name: Tonya Higgins MRN: Haynes Hoehn Date of Birth: 07-01-1970

## 2020-07-03 ENCOUNTER — Ambulatory Visit: Payer: BC Managed Care – PPO | Admitting: Physical Therapy

## 2020-07-03 DIAGNOSIS — M542 Cervicalgia: Secondary | ICD-10-CM | POA: Diagnosis not present

## 2020-07-07 ENCOUNTER — Encounter: Payer: Self-pay | Admitting: Physical Therapy

## 2020-07-07 NOTE — Therapy (Signed)
[facility] [place] [facility] [address] [place], [place], [address] Phone: [phone]   Fax:  [phone]  Physical Therapy Treatment  Patient Details  Name: Tonya Higgins MRN: [id number] Date of Birth: Jul 20, 1970 Referring Provider (PT): Asencion Partridge   Encounter Date: 07/03/2020   PT End of Session - 07/07/20 1417    Visit Number 2    Number of Visits 12    Date for PT Re-Evaluation 08/12/20    Authorization Type BCBS    PT Start Time 1520    PT Stop Time 1610    PT Time Calculation (min) 50 min    Activity Tolerance Patient tolerated treatment well    Behavior During Therapy [facility] for tasks assessed/performed           Past Medical History:  Diagnosis Date  . Asthma   . GERD (gastroesophageal reflux disease)     Past Surgical History:  Procedure Laterality Date  . CESAREAN SECTION     2006 and 2012    There were no vitals filed for this visit.   Subjective Assessment - 07/07/20 1417    Subjective Pt states doing HEP. States continued tension in UT region .    Currently in Pain? Yes    Pain Score 5     Pain Location Neck    Pain Descriptors / Indicators Aching;Tightness    Pain Type Acute pain    Pain Onset More than a month ago    Pain Frequency Intermittent                             [facility] Adult PT Treatment/Exercise - 07/07/20 0001      Neck Exercises: Theraband   Rows Green;20 reps    Shoulder External Rotation Red;20 reps    Horizontal ABduction 15 reps    Horizontal ABduction Limitations YTB      Neck Exercises: Standing   Neck Retraction 15 reps    Neck Retraction Limitations education on head posture and neck pain      Modalities   Modalities Moist Heat      Moist Heat Therapy   Number Minutes Moist Heat 10 Minutes    Moist Heat Location Cervical      Manual Therapy   Manual therapy comments skilled palpation and monitoring of soft tissue with dry needling    Joint Mobilization  Cervical, PA mobs    Soft tissue mobilization STM/DTM to bil UT, cervical paraspinals, SOR    Passive ROM manual UT and levator stretches    Manual Traction cervical 10 s x 10      Neck Exercises: Stretches   Upper Trapezius Stretch 2 reps;30 seconds    Upper Trapezius Stretch Limitations bil            Trigger Point Dry Needling - 07/07/20 0001    Consent Given? Yes    Education Handout Provided Yes    Muscles Treated Head and Neck Upper trapezius;Levator scapulae    Upper Trapezius Response Twitch reponse elicited;Palpable increased muscle length   bil   Levator Scapulae Response Twitch response elicited;Palpable increased muscle length   bil                 PT Short Term Goals - 07/02/20 1208      PT SHORT TERM GOAL #1   Title Pt to be independent with initial HEP    Time 2    Period Weeks  Status New    Target Date 07/15/20             PT Long Term Goals - 07/02/20 1209      PT LONG TERM GOAL #1   Title Pt to be independent with final HEP    Time 6    Period Weeks    Status New    Target Date 08/12/20      PT LONG TERM GOAL #2   Title Pt to reprot pain in neck to 0-2/10 with work duties    Time 6    Period Weeks    Status New    Target Date 08/12/20      PT LONG TERM GOAL #3   Title Pt to demo improved strength of scapular muscles to be at least 4+/5 for improved pain and posture    Time 6    Period Weeks    Status New    Target Date 08/12/20      PT LONG TERM GOAL #4   Title Pt to demo soft tissue limitations in cervical /thoracic region to be WNL for improved pain.    Time 6    Period Weeks    Status New    Target Date 08/12/20                 Plan - 07/07/20 1418    Clinical Impression Statement Pt with good tolerance for dry needling today. Done to address painful trigger points in UT region. Started light postural strenthening, updated HEP. Pt req max cuing for proper form for strengthening exercises. Will benefit from  continued education on posture and postural strengthening.    Examination-Activity Limitations Lift;Reach Overhead;Carry    Examination-Participation Restrictions Cleaning;Meal Prep;Community Activity;Occupation;Driving    Stability/Clinical Decision Making Stable/Uncomplicated    Rehab Potential Good    PT Frequency 2x / week    PT Duration 6 weeks    PT Treatment/Interventions ADLs/Self Care Home Management;Cryotherapy;Electrical Stimulation;Ultrasound;Traction;Moist Heat;Iontophoresis 4mg /ml Dexamethasone;Functional mobility training;Therapeutic activities;Therapeutic exercise;Patient/family education;Neuromuscular re-education;Manual techniques;Passive range of motion;Dry needling;Visual/perceptual remediation/compensation;Spinal Manipulations;Joint Manipulations;Taping    PT Home Exercise Plan [email or web address]    Consulted and Agree with Plan of Care Patient           Patient will benefit from skilled therapeutic intervention in order to improve the following deficits and impairments:  Pain,Improper body mechanics,Increased muscle spasms,Decreased activity tolerance,Decreased range of motion,Decreased strength  Visit Diagnosis: Cervicalgia     Problem List Patient Active Problem List   Diagnosis Date Noted  . Keratosis pilaris 04/11/2018  . Gastroesophageal reflux disease 03/29/2018  . Mild intermittent asthma 05/20/2017    07/18/2017, PT, DPT 2:20 PM  07/07/20     [facility] [address] [place], [place], [place] Phone: [phone]   Fax:  [phone]  Name: Gennavieve Huq MRN: Haynes Hoehn Date of Birth: Oct 24, 1970

## 2020-07-08 ENCOUNTER — Other Ambulatory Visit: Payer: Self-pay

## 2020-07-08 ENCOUNTER — Encounter: Payer: Self-pay | Admitting: Physical Therapy

## 2020-07-08 ENCOUNTER — Ambulatory Visit: Payer: BC Managed Care – PPO | Admitting: Physical Therapy

## 2020-07-08 DIAGNOSIS — M542 Cervicalgia: Secondary | ICD-10-CM | POA: Diagnosis not present

## 2020-07-08 NOTE — Therapy (Signed)
[facility] [place] [facility] [address] [place], [place], [address] Phone: [phone]   Fax:  [phone]  Physical Therapy Treatment  Patient Details  Name: Tonya Higgins MRN: [id number] Date of Birth: 04/15/1971 Referring Provider (PT): Asencion Partridge   Encounter Date: 07/08/2020   PT End of Session - 07/08/20 1210    Visit Number 3    Number of Visits 12    Date for PT Re-Evaluation 08/12/20    Authorization Type BCBS    PT Start Time 0935    PT Stop Time 1013    PT Time Calculation (min) 38 min    Activity Tolerance Patient tolerated treatment well    Behavior During Therapy [facility] for tasks assessed/performed           Past Medical History:  Diagnosis Date  . Asthma   . GERD (gastroesophageal reflux disease)     Past Surgical History:  Procedure Laterality Date  . CESAREAN SECTION     2006 and 2012    There were no vitals filed for this visit.   Subjective Assessment - 07/08/20 1210    Subjective Pt states doing better, less pain after dry needling.    Currently in Pain? Yes    Pain Score 3     Pain Location Neck    Pain Orientation Left    Pain Descriptors / Indicators Aching;Tightness    Pain Type Acute pain    Pain Onset More than a month ago    Pain Frequency Intermittent                             [facility] Adult PT Treatment/Exercise - 07/08/20 0001      Neck Exercises: Theraband   Rows Green;20 reps    Shoulder External Rotation Red;20 reps    Horizontal ABduction 15 reps    Horizontal ABduction Limitations supine: RTB      Neck Exercises: Standing   Neck Retraction 15 reps    Neck Retraction Limitations education on head posture and neck pain    Other Standing Exercises Standing shoulder scaption and abd, with education on shoulder form and decreasing UT tightness. x 15 ea;      Modalities   Modalities Moist Heat      Moist Heat Therapy   Moist Heat Location Cervical      Manual  Therapy   Joint Mobilization Cervical, PA mobs    Soft tissue mobilization IASTM/ DTM to L UT, STM to cervical paraspinals, SOR    Passive ROM manual UT and levator stretches    Manual Traction cervical 10 s x 10      Neck Exercises: Stretches   Upper Trapezius Stretch 2 reps;30 seconds    Upper Trapezius Stretch Limitations bil    Other Neck Stretches Doorway stretch 45 deg x 30 sec x 4;                    PT Short Term Goals - 07/02/20 1208      PT SHORT TERM GOAL #1   Title Pt to be independent with initial HEP    Time 2    Period Weeks    Status New    Target Date 07/15/20             PT Long Term Goals - 07/02/20 1209      PT LONG TERM GOAL #1   Title Pt to be independent  with final HEP    Time 6    Period Weeks    Status New    Target Date 08/12/20      PT LONG TERM GOAL #2   Title Pt to reprot pain in neck to 0-2/10 with work duties    Time 6    Period Weeks    Status New    Target Date 08/12/20      PT LONG TERM GOAL #3   Title Pt to demo improved strength of scapular muscles to be at least 4+/5 for improved pain and posture    Time 6    Period Weeks    Status New    Target Date 08/12/20      PT LONG TERM GOAL #4   Title Pt to demo soft tissue limitations in cervical /thoracic region to be WNL for improved pain.    Time 6    Period Weeks    Status New    Target Date 08/12/20                 Plan - 07/08/20 1213    Clinical Impression Statement Pt with overall improving ROM and pain. Still has some limitation in L rotation, in L UT. Tightness also improved, but continues to be source of pain. Pt benefitting from manual and strengthening. Req mod-max cues for optimal mechanics with strengthening exercises.    Examination-Activity Limitations Lift;Reach Overhead;Carry    Examination-Participation Restrictions Cleaning;Meal Prep;Community Activity;Occupation;Driving    Stability/Clinical Decision Making Stable/Uncomplicated    Rehab  Potential Good    PT Frequency 2x / week    PT Duration 6 weeks    PT Treatment/Interventions ADLs/Self Care Home Management;Cryotherapy;Electrical Stimulation;Ultrasound;Traction;Moist Heat;Iontophoresis 4mg /ml Dexamethasone;Functional mobility training;Therapeutic activities;Therapeutic exercise;Patient/family education;Neuromuscular re-education;Manual techniques;Passive range of motion;Dry needling;Visual/perceptual remediation/compensation;Spinal Manipulations;Joint Manipulations;Taping    PT Home Exercise Plan [email or web address]    Consulted and Agree with Plan of Care Patient           Patient will benefit from skilled therapeutic intervention in order to improve the following deficits and impairments:  Pain,Improper body mechanics,Increased muscle spasms,Decreased activity tolerance,Decreased range of motion,Decreased strength  Visit Diagnosis: Cervicalgia     Problem List Patient Active Problem List   Diagnosis Date Noted  . Keratosis pilaris 04/11/2018  . Gastroesophageal reflux disease 03/29/2018  . Mild intermittent asthma 05/20/2017   07/18/2017, PT, DPT 12:16 PM  07/08/20    [facility] [place] [facility] [address] [place], [place], [place] Phone: [phone]   Fax:  [phone]  Name: Tonya Higgins MRN: Haynes Hoehn Date of Birth: 28-Aug-1970

## 2020-07-11 ENCOUNTER — Other Ambulatory Visit: Payer: Self-pay

## 2020-07-11 ENCOUNTER — Encounter: Payer: Self-pay | Admitting: Physical Therapy

## 2020-07-11 ENCOUNTER — Ambulatory Visit: Payer: BC Managed Care – PPO | Admitting: Physical Therapy

## 2020-07-11 DIAGNOSIS — M542 Cervicalgia: Secondary | ICD-10-CM | POA: Diagnosis not present

## 2020-07-11 NOTE — Therapy (Signed)
[facility] [place] [facility] [address] [place], [place], [address] Phone: [phone]   Fax:  [phone]  Physical Therapy Treatment  Patient Details  Name: Tonya Higgins MRN: [id number] Date of Birth: 1970/12/08 Referring Provider (PT): Asencion Partridge   Encounter Date: 07/11/2020   PT End of Session - 07/11/20 2135    Visit Number 4    Number of Visits 12    Date for PT Re-Evaluation 08/12/20    Authorization Type BCBS    PT Start Time 0803    PT Stop Time 0844    PT Time Calculation (min) 41 min    Activity Tolerance Patient tolerated treatment well    Behavior During Therapy [facility] for tasks assessed/performed           Past Medical History:  Diagnosis Date  . Asthma   . GERD (gastroesophageal reflux disease)     Past Surgical History:  Procedure Laterality Date  . CESAREAN SECTION     2006 and 2012    There were no vitals filed for this visit.   Subjective Assessment - 07/11/20 2128    Subjective Pt states doing much better, minimal pain in last couple days. Has been able to go through most of work day without pain this week.    Currently in Pain? No/denies    Pain Score 0-No pain                             [facility] Adult PT Treatment/Exercise - 07/11/20 0001      Neck Exercises: Theraband   Rows Green;20 reps    Shoulder External Rotation Red;20 reps    Horizontal ABduction 15 reps    Horizontal ABduction Limitations supine: RTB      Neck Exercises: Standing   Neck Retraction 15 reps    Other Standing Exercises Standing shoulder scaption and abd, with education on shoulder form and decreasing shoulder hiking  x 15 ea;    Other Standing Exercises Wall push ups x 20;      Modalities   Modalities Moist Heat      Manual Therapy   Joint Mobilization Cervical, PA mobs    Soft tissue mobilization STM/DTM to cervical paraspinals, SOR, UTs,    Passive ROM manual UT and levator stretches    Manual Traction  cervical 10 s x 10      Neck Exercises: Stretches   Upper Trapezius Stretch 2 reps;30 seconds    Upper Trapezius Stretch Limitations bil    Other Neck Stretches Doorway stretch 45 deg x 30 sec x 4;                  PT Education - 07/11/20 2133    Education Details Reviewed HEP    Person(s) Educated Patient    Methods Explanation;Demonstration;Tactile cues;Verbal cues    Comprehension Verbalized understanding;Returned demonstration;Verbal cues required;Tactile cues required;Need further instruction            PT Short Term Goals - 07/11/20 2135      PT SHORT TERM GOAL #1   Title Pt to be independent with initial HEP    Time 2    Period Weeks    Status Achieved    Target Date 07/15/20             PT Long Term Goals - 07/02/20 1209      PT LONG TERM GOAL #1   Title Pt to be  independent with final HEP    Time 6    Period Weeks    Status New    Target Date 08/12/20      PT LONG TERM GOAL #2   Title Pt to reprot pain in neck to 0-2/10 with work duties    Time 6    Period Weeks    Status New    Target Date 08/12/20      PT LONG TERM GOAL #3   Title Pt to demo improved strength of scapular muscles to be at least 4+/5 for improved pain and posture    Time 6    Period Weeks    Status New    Target Date 08/12/20      PT LONG TERM GOAL #4   Title Pt to demo soft tissue limitations in cervical /thoracic region to be WNL for improved pain.    Time 6    Period Weeks    Status New    Target Date 08/12/20                 Plan - 07/11/20 2136    Clinical Impression Statement Pt progressing well, with much decreased pain. Pt doing better with computer posture and more frequent movment throughout the day. Pt with mild tightness in cervical musculature, but no painful trigger points today. Pt to benefit from continued care for postural strengthening and education on HEP. Likely d/c next visit if pt still with minimal pain.    Examination-Activity  Limitations Lift;Reach Overhead;Carry    Examination-Participation Restrictions Cleaning;Meal Prep;Community Activity;Occupation;Driving    Stability/Clinical Decision Making Stable/Uncomplicated    Rehab Potential Good    PT Frequency 2x / week    PT Duration 6 weeks    PT Treatment/Interventions ADLs/Self Care Home Management;Cryotherapy;Electrical Stimulation;Ultrasound;Traction;Moist Heat;Iontophoresis 4mg /ml Dexamethasone;Functional mobility training;Therapeutic activities;Therapeutic exercise;Patient/family education;Neuromuscular re-education;Manual techniques;Passive range of motion;Dry needling;Visual/perceptual remediation/compensation;Spinal Manipulations;Joint Manipulations;Taping    PT Home Exercise Plan [email or web address]    Consulted and Agree with Plan of Care Patient           Patient will benefit from skilled therapeutic intervention in order to improve the following deficits and impairments:  Pain,Improper body mechanics,Increased muscle spasms,Decreased activity tolerance,Decreased range of motion,Decreased strength  Visit Diagnosis: Cervicalgia     Problem List Patient Active Problem List   Diagnosis Date Noted  . Keratosis pilaris 04/11/2018  . Gastroesophageal reflux disease 03/29/2018  . Mild intermittent asthma 05/20/2017    07/18/2017, PT, DPT 9:38 PM  07/11/20    [place] [facility] [address] [place], [place], [place] Phone: [phone]   Fax:  [phone]  Name: Tonya Higgins MRN: Haynes Hoehn Date of Birth: 1971-03-20

## 2020-07-11 NOTE — Patient Instructions (Signed)
Access Code: [email or web address] URL: [email or web address] Date: 07/11/2020 Prepared by: Sedalia Muta  Exercises Seated Cervical Sidebending Stretch - 2 x daily - 3 reps - 30 hold Standing Backward Shoulder Rolls - 3 x daily - 1 sets - 10 reps Standing Cervical Retraction - 2 x daily - 1 sets - 10 reps Seated Scapular Retraction - 3 x daily - 1 sets - 10 reps Standing Row with Anchored Resistance - 1 x daily - 1 sets - 10 reps Standing Shoulder External Rotation with Resistance - 1 x daily - 1 sets - 10 reps Tricep Push Up on Wall - 1 x daily - 1-2 sets - 10 reps Supine Shoulder Horizontal Abduction with Resistance - 1 x daily - 1-2 sets - 10 reps

## 2020-07-15 ENCOUNTER — Encounter: Payer: BC Managed Care – PPO | Admitting: Physical Therapy

## 2020-07-18 ENCOUNTER — Ambulatory Visit: Payer: BC Managed Care – PPO | Admitting: Physical Therapy

## 2020-07-18 ENCOUNTER — Encounter: Payer: Self-pay | Admitting: Physical Therapy

## 2020-07-18 ENCOUNTER — Other Ambulatory Visit: Payer: Self-pay

## 2020-07-18 DIAGNOSIS — M542 Cervicalgia: Secondary | ICD-10-CM | POA: Diagnosis not present

## 2020-07-18 NOTE — Therapy (Signed)
[facility] [place] [facility] [address] [place], [place], [address] Phone: [phone]   Fax:  [phone]  Physical Therapy Treatment  Patient Details  Name: Tonya Higgins MRN: [id number] Date of Birth: Sep 18, 1970 Referring Provider (PT): Asencion Partridge   Encounter Date: 07/18/2020   PT End of Session - 07/18/20 0926    Visit Number 5    Number of Visits 12    Date for PT Re-Evaluation 08/12/20    Authorization Type BCBS    PT Start Time 0848    PT Stop Time 0929    PT Time Calculation (min) 41 min    Activity Tolerance Patient tolerated treatment well    Behavior During Therapy [facility] for tasks assessed/performed           Past Medical History:  Diagnosis Date  . Asthma   . GERD (gastroesophageal reflux disease)     Past Surgical History:  Procedure Laterality Date  . CESAREAN SECTION     2006 and 2012    There were no vitals filed for this visit.   Subjective Assessment - 07/18/20 0854    Subjective Pt states she was doing well after last sessoin but over the last few days she had more L sided neck pain. She states it brings on HA that lasts the entire day. Worst is 8/10. Best 0/10. Pt states she cannot think of an exercise changes that may have caused it. She does states she has done more around the home recently and also had mutliple speaking events for work that caused increased stress and muslce tension around the neck. No pain currently.    Limitations Sitting;Reading;Writing;House hold activities;Lifting    Diagnostic tests recent x-ray result:  There is moderate disc space narrowing at C5-6 with slightly milder  disc space narrowing at C4-5. Small anterior osteophytes noted at C5  and C6. There is facet hypertrophy with exit foraminal narrowing at  C4-5 and C5-6 bilaterally    Currently in Pain? No/denies    Pain Score 0-No pain    Pain Orientation Left    Pain Onset More than a month ago                              [facility] Adult PT Treatment/Exercise - 07/18/20 0001      Neck Exercises: Theraband   Rows Green;20 reps    Shoulder External Rotation Red;20 reps    Horizontal ABduction 15 reps    Horizontal ABduction Limitations supine: RTB      Neck Exercises: Standing   Neck Retraction 15 reps    Other Standing Exercises Standing shoulder scaption and abd, with education on shoulder form and decreasing shoulder hiking  x 15 ea;    Other Standing Exercises Wall push ups x 20;               Manual Therapy   Joint Mobilization Cervical, PA mobs    Soft tissue mobilization STM/DTM to cervical paraspinals, SOR, UTs,    Passive ROM manual UT and levator stretches    Manual Traction cervical 10 s x 10      Neck Exercises: Stretches   Upper Trapezius Stretch 2 reps;30 seconds    Upper Trapezius Stretch Limitations bil    Other Neck Stretches Doorway stretch 45 deg x 30 sec x 4;                  PT Education -  07/18/20 4970    Education Details HEP, frequency of exercise, daily positional changes while working, posture, progressive muscle relaxation video    Person(s) Educated Patient    Methods Explanation;Demonstration    Comprehension Returned demonstration;Verbalized understanding            PT Short Term Goals - 07/11/20 2135      PT SHORT TERM GOAL #1   Title Pt to be independent with initial HEP    Time 2    Period Weeks    Status Achieved    Target Date 07/15/20             PT Long Term Goals - 07/02/20 1209      PT LONG TERM GOAL #1   Title Pt to be independent with final HEP    Time 6    Period Weeks    Status New    Target Date 08/12/20      PT LONG TERM GOAL #2   Title Pt to reprot pain in neck to 0-2/10 with work duties    Time 6    Period Weeks    Status New    Target Date 08/12/20      PT LONG TERM GOAL #3   Title Pt to demo improved strength of scapular muscles to be at least 4+/5 for improved pain and  posture    Time 6    Period Weeks    Status New    Target Date 08/12/20      PT LONG TERM GOAL #4   Title Pt to demo soft tissue limitations in cervical /thoracic region to be WNL for improved pain.    Time 6    Period Weeks    Status New    Target Date 08/12/20                 Plan - 07/18/20 0931    Clinical Impression Statement Pt presented with increased L sided cervical muscle guarding and joint stiffness at today's session that was relieved with exercise and manual therapy. Increase in pain is likely due to pts report of increased stress/tension due to work and increasing daily physical activity. Pt required increased VC and TC for scapular setting and reducing UT compensation during exercise. Pt also demonstrated increased endurance deficits and fatigue with continued repetitions. Pt required an extended rest break to reduce periscapular muscle "burning" sensation. Pt would still benefit from continued skilled therapy in order to reach goals, maximize postural motor control, and return to pain free ADL.    Examination-Activity Limitations Lift;Reach Overhead;Carry    Examination-Participation Restrictions Cleaning;Meal Prep;Community Activity;Occupation;Driving    Stability/Clinical Decision Making Stable/Uncomplicated    Rehab Potential Good    PT Frequency 2x / week    PT Duration 6 weeks    PT Treatment/Interventions ADLs/Self Care Home Management;Cryotherapy;Electrical Stimulation;Ultrasound;Traction;Moist Heat;Iontophoresis 4mg /ml Dexamethasone;Functional mobility training;Therapeutic activities;Therapeutic exercise;Patient/family education;Neuromuscular re-education;Manual techniques;Passive range of motion;Dry needling;Visual/perceptual remediation/compensation;Spinal Manipulations;Joint Manipulations;Taping    PT Home Exercise Plan [email or web address]    Consulted and Agree with Plan of Care Patient           Patient will benefit from skilled therapeutic intervention in  order to improve the following deficits and impairments:  Pain,Improper body mechanics,Increased muscle spasms,Decreased activity tolerance,Decreased range of motion,Decreased strength  Visit Diagnosis: Cervicalgia     Problem List Patient Active Problem List   Diagnosis Date Noted  . Keratosis pilaris 04/11/2018  . Gastroesophageal reflux disease 03/29/2018  . Mild intermittent asthma 05/20/2017  Zebedee Iba PT, DPT 07/18/20 9:58 AM   [place]  [facility] [address] [place], [place], [address] Phone: [phone]   Fax:  [phone]  Name: Tonya Higgins MRN: [id number] Date of Birth: [birth year]

## 2020-07-22 ENCOUNTER — Ambulatory Visit: Payer: BC Managed Care – PPO | Admitting: Physical Therapy

## 2020-07-22 ENCOUNTER — Encounter: Payer: Self-pay | Admitting: Physical Therapy

## 2020-07-22 ENCOUNTER — Other Ambulatory Visit: Payer: Self-pay

## 2020-07-22 DIAGNOSIS — M542 Cervicalgia: Secondary | ICD-10-CM | POA: Diagnosis not present

## 2020-07-22 NOTE — Therapy (Signed)
[facility] [address] [place], [place], [address] Phone: [phone]   Fax:  [phone]  Physical Therapy Treatment  Patient Details  Name: Tonya Higgins MRN: [id number] Date of Birth: 11-29-1970 Referring Provider (PT): Asencion Partridge   Encounter Date: 07/22/2020   PT End of Session - 07/22/20 1246    Visit Number 6    Number of Visits 12    Date for PT Re-Evaluation 08/12/20    Authorization Type BCBS    PT Start Time 0850    PT Stop Time 0930    PT Time Calculation (min) 40 min    Activity Tolerance Patient tolerated treatment well    Behavior During Therapy [facility] for tasks assessed/performed           Past Medical History:  Diagnosis Date  . Asthma   . GERD (gastroesophageal reflux disease)     Past Surgical History:  Procedure Laterality Date  . CESAREAN SECTION     2006 and 2012    There were no vitals filed for this visit.   Subjective Assessment - 07/22/20 1245    Subjective Pt states improved pain since last visit, but still feels "tightness" in L UT region, mostly with increased use of L UE. Also feels stress impacts her pain and tension.    Currently in Pain? No/denies    Pain Score 0-No pain                             [facility] Adult PT Treatment/Exercise - 07/22/20 0001      Neck Exercises: Theraband   Rows Green;20 reps    Shoulder External Rotation Red;20 reps    Horizontal ABduction 15 reps    Horizontal ABduction Limitations supine: RTB      Neck Exercises: Standing   Neck Retraction 15 reps    Other Standing Exercises Standing shoulder scaption and abd, with education on shoulder form and decreasing shoulder hiking  x 15 ea;    Other Standing Exercises Wall push ups x 20; Ball cirlcles at wall for scap stabs 2x10 bil;      Manual Therapy   Manual therapy comments skilled palpation and monitoring of soft tissue with dry needling    Soft tissue mobilization STM/DTM to  cervical paraspinals, UTs,    Passive ROM manual UT and levator stretches    Manual Traction cervical 10 s x 10      Neck Exercises: Stretches   Upper Trapezius Stretch 2 reps;30 seconds    Upper Trapezius Stretch Limitations bil    Other Neck Stretches Doorway stretch 45 deg x 30 sec x 4;            Trigger Point Dry Needling - 07/22/20 0001    Consent Given? Yes    Education Handout Provided Previously provided    Muscles Treated Head and Neck Upper trapezius    Upper Trapezius Response Twitch reponse elicited;Palpable increased muscle length   BIlateral                 PT Short Term Goals - 07/11/20 2135      PT SHORT TERM GOAL #1   Title Pt to be independent with initial HEP    Time 2    Period Weeks    Status Achieved    Target Date 07/15/20             PT Long Term Goals - 07/02/20  1209      PT LONG TERM GOAL #1   Title Pt to be independent with final HEP    Time 6    Period Weeks    Status New    Target Date 08/12/20      PT LONG TERM GOAL #2   Title Pt to reprot pain in neck to 0-2/10 with work duties    Time 6    Period Weeks    Status New    Target Date 08/12/20      PT LONG TERM GOAL #3   Title Pt to demo improved strength of scapular muscles to be at least 4+/5 for improved pain and posture    Time 6    Period Weeks    Status New    Target Date 08/12/20      PT LONG TERM GOAL #4   Title Pt to demo soft tissue limitations in cervical /thoracic region to be WNL for improved pain.    Time 6    Period Weeks    Status New    Target Date 08/12/20                 Plan - 07/22/20 1248    Clinical Impression Statement Pt with improving strength in scapular muslces, but still requires verbal cuing to decrease UT tension. Continued focus today on shoulder AROM and strengthening without increased compesnation. Pt does have painful tension in UT region, addressed with dry needling, due to positve respose previously. Pt with good twitch  response and good tolerance. Pt to benefit from continued care for decreasing muscle tension, and improving UE strength for functional activity without compensation.    Examination-Activity Limitations Lift;Reach Overhead;Carry    Examination-Participation Restrictions Cleaning;Meal Prep;Community Activity;Occupation;Driving    Stability/Clinical Decision Making Stable/Uncomplicated    Rehab Potential Good    PT Frequency 2x / week    PT Duration 6 weeks    PT Treatment/Interventions ADLs/Self Care Home Management;Cryotherapy;Electrical Stimulation;Ultrasound;Traction;Moist Heat;Iontophoresis 4mg /ml Dexamethasone;Functional mobility training;Therapeutic activities;Therapeutic exercise;Patient/family education;Neuromuscular re-education;Manual techniques;Passive range of motion;Dry needling;Visual/perceptual remediation/compensation;Spinal Manipulations;Joint Manipulations;Taping    PT Home Exercise Plan [email or web address]    Consulted and Agree with Plan of Care Patient           Patient will benefit from skilled therapeutic intervention in order to improve the following deficits and impairments:  Pain,Improper body mechanics,Increased muscle spasms,Decreased activity tolerance,Decreased range of motion,Decreased strength  Visit Diagnosis: Cervicalgia     Problem List Patient Active Problem List   Diagnosis Date Noted  . Keratosis pilaris 04/11/2018  . Gastroesophageal reflux disease 03/29/2018  . Mild intermittent asthma 05/20/2017    07/18/2017, PT, DPT 12:51 PM  07/22/20    [facility] [address] [place], [place], [place] Phone: [phone]   Fax:  [phone]  Name: Tonya Higgins MRN: Haynes Hoehn Date of Birth: Aug 04, 1970

## 2020-07-25 ENCOUNTER — Ambulatory Visit: Payer: BC Managed Care – PPO | Admitting: Physical Therapy

## 2020-07-25 ENCOUNTER — Other Ambulatory Visit: Payer: Self-pay

## 2020-07-25 ENCOUNTER — Encounter: Payer: Self-pay | Admitting: Physical Therapy

## 2020-07-25 DIAGNOSIS — M542 Cervicalgia: Secondary | ICD-10-CM

## 2020-07-25 NOTE — Therapy (Signed)
[facility] [address] [place], [place], [address] Phone: [phone]   Fax:  [phone]  Physical Therapy Treatment  Patient Details  Name: Tonya Higgins MRN: [id number] Date of Birth: 05/25/1970 Referring Provider (PT): Asencion Partridge   Encounter Date: 07/25/2020   PT End of Session - 07/25/20 0943    Visit Number 7    Number of Visits 12    Date for PT Re-Evaluation 08/12/20    Authorization Type BCBS    PT Start Time 0848    PT Stop Time 0930    PT Time Calculation (min) 42 min    Activity Tolerance Patient tolerated treatment well    Behavior During Therapy [facility] for tasks assessed/performed           Past Medical History:  Diagnosis Date  . Asthma   . GERD (gastroesophageal reflux disease)     Past Surgical History:  Procedure Laterality Date  . CESAREAN SECTION     2006 and 2012    There were no vitals filed for this visit.   Subjective Assessment - 07/25/20 0942    Subjective Pt states improved pain since last week. Still feels neck is "tight" at night, and also reports headaches when working on computer for a couple hours.    Currently in Pain? Yes    Pain Score 1     Pain Location Neck    Pain Orientation Left;Right    Pain Descriptors / Indicators Tightness    Pain Type Acute pain    Pain Onset More than a month ago    Pain Frequency Intermittent                             [facility] Adult PT Treatment/Exercise - 07/25/20 0001      Neck Exercises: Theraband   Rows Green;20 reps    Shoulder External Rotation Red;20 reps    Horizontal ABduction 15 reps    Horizontal ABduction Limitations supine: RTB      Neck Exercises: Standing   Neck Retraction 15 reps    Neck Retraction Limitations supine    Other Standing Exercises Standing shoulder scaption and abd 1 lb , with education on shoulder form and decreasing shoulder hiking  x 15 ea;    Other Standing Exercises Wall push ups x 20;       Neck Exercises: Supine   Other Supine Exercise horiz abd 2 lb x 15;  small range flex at 90/90 2 lb for shoulder posture      Manual Therapy   Joint Mobilization PA mobs and side glides, c-spine    Soft tissue mobilization STM/DTM to cervical paraspinals, UTs,    Passive ROM manual UT and levator stretches    Manual Traction cervical 10 s x 10                    PT Short Term Goals - 07/11/20 2135      PT SHORT TERM GOAL #1   Title Pt to be independent with initial HEP    Time 2    Period Weeks    Status Achieved    Target Date 07/15/20             PT Long Term Goals - 07/02/20 1209      PT LONG TERM GOAL #1   Title Pt to be independent with final HEP    Time 6  Period Weeks    Status New    Target Date 08/12/20      PT LONG TERM GOAL #2   Title Pt to reprot pain in neck to 0-2/10 with work duties    Time 6    Period Weeks    Status New    Target Date 08/12/20      PT LONG TERM GOAL #3   Title Pt to demo improved strength of scapular muscles to be at least 4+/5 for improved pain and posture    Time 6    Period Weeks    Status New    Target Date 08/12/20      PT LONG TERM GOAL #4   Title Pt to demo soft tissue limitations in cervical /thoracic region to be WNL for improved pain.    Time 6    Period Weeks    Status New    Target Date 08/12/20                 Plan - 07/25/20 0944    Clinical Impression Statement Pt improving with ability for postural strengthening. Still challenged with decreasing UT compensation. Discussed sleeping and pillow positions for improved comfort. Also discussed blue light glasses and/or eye exam if frontal headaches continue. Pt to benefit from continued care, at decreased frequency at 1x/wk for continuing strengthening and decreasing muscle tension.    Examination-Activity Limitations Lift;Reach Overhead;Carry    Examination-Participation Restrictions Cleaning;Meal Prep;Community  Activity;Occupation;Driving    Stability/Clinical Decision Making Stable/Uncomplicated    Rehab Potential Good    PT Frequency 2x / week    PT Duration 6 weeks    PT Treatment/Interventions ADLs/Self Care Home Management;Cryotherapy;Electrical Stimulation;Ultrasound;Traction;Moist Heat;Iontophoresis 4mg /ml Dexamethasone;Functional mobility training;Therapeutic activities;Therapeutic exercise;Patient/family education;Neuromuscular re-education;Manual techniques;Passive range of motion;Dry needling;Visual/perceptual remediation/compensation;Spinal Manipulations;Joint Manipulations;Taping    PT Home Exercise Plan [email or web address]    Consulted and Agree with Plan of Care Patient           Patient will benefit from skilled therapeutic intervention in order to improve the following deficits and impairments:  Pain,Improper body mechanics,Increased muscle spasms,Decreased activity tolerance,Decreased range of motion,Decreased strength  Visit Diagnosis: Cervicalgia     Problem List Patient Active Problem List   Diagnosis Date Noted  . Keratosis pilaris 04/11/2018  . Gastroesophageal reflux disease 03/29/2018  . Mild intermittent asthma 05/20/2017   07/18/2017, PT, DPT 9:48 AM  07/25/20    [facility] [address] [place], [place], [place] Phone: [phone]   Fax:  [phone]  Name: Tonya Higgins MRN: Haynes Hoehn Date of Birth: 13-May-1970

## 2020-08-01 ENCOUNTER — Encounter: Payer: Self-pay | Admitting: Physical Therapy

## 2020-08-01 ENCOUNTER — Other Ambulatory Visit: Payer: Self-pay

## 2020-08-01 ENCOUNTER — Ambulatory Visit: Payer: BC Managed Care – PPO | Admitting: Physical Therapy

## 2020-08-01 DIAGNOSIS — M542 Cervicalgia: Secondary | ICD-10-CM

## 2020-08-01 NOTE — Therapy (Signed)
[facility] [address] [place], [place], [address] Phone: [phone]   Fax:  [phone]  Physical Therapy Treatment/Discharge  Patient Details  Name: Tonya Higgins MRN: [id number] Date of Birth: 04/04/1971 Referring Provider (PT): Billey Chang   Encounter Date: 08/01/2020   PT End of Session - 08/01/20 0840    Visit Number 8    Number of Visits 12    Date for PT Re-Evaluation 08/12/20    Authorization Type BCBS    PT Start Time 0805    PT Stop Time 0832    PT Time Calculation (min) 27 min    Activity Tolerance Patient tolerated treatment well    Behavior During Therapy [facility] for tasks assessed/performed           Past Medical History:  Diagnosis Date  . Asthma   . GERD (gastroesophageal reflux disease)     Past Surgical History:  Procedure Laterality Date  . CESAREAN SECTION     2006 and 2012    There were no vitals filed for this visit.   Subjective Assessment - 08/01/20 0810    Subjective Pt states doing very well, has not had any pain in several days. Has been diligent with HEP    Currently in Pain? No/denies                             [facility] Adult PT Treatment/Exercise - 08/01/20 0001      Neck Exercises: Theraband   Rows Green;20 reps    Shoulder External Rotation Red;20 reps    Horizontal ABduction 15 reps    Horizontal ABduction Limitations standing      Neck Exercises: Standing   Other Standing Exercises Standing shoulder scaption 2 lb x15 , with education on shoulder form ;  Low Row GTB x 15;  Military press 2 lb x 10, 5 lb x 8;    Other Standing Exercises Wall push ups x 20;      Manual Therapy   Joint Mobilization PA mobs  c-spine      Neck Exercises: Stretches   Upper Trapezius Stretch 2 reps;30 seconds    Upper Trapezius Stretch Limitations bil                  PT Education - 08/01/20 0840    Education Details FInal HEP reviewed in detail.    Person(s)  Educated Patient    Methods Explanation;Demonstration;Handout    Comprehension Verbalized understanding;Returned demonstration            PT Short Term Goals - 07/11/20 2135      PT SHORT TERM GOAL #1   Title Pt to be independent with initial HEP    Time 2    Period Weeks    Status Achieved    Target Date 07/15/20             PT Long Term Goals - 08/01/20 0840      PT LONG TERM GOAL #1   Title Pt to be independent with final HEP    Time 6    Period Weeks    Status Achieved      PT LONG TERM GOAL #2   Title Pt to reprot pain in neck to 0-2/10 with work duties    Time 6    Period Weeks    Status Achieved      PT LONG TERM GOAL #3   Title Pt  to demo improved strength of scapular muscles to be at least 4+/5 for improved pain and posture    Time 6    Period Weeks    Status Achieved      PT LONG TERM GOAL #4   Title Pt to demo soft tissue limitations in cervical /thoracic region to be WNL for improved pain.    Time 6    Period Weeks    Status Achieved                 Plan - 08/01/20 0841    Clinical Impression Statement Pt has made good progress, having no pain at this time. She is doing very well with HEP and taking breaks from work/computer. Pt also with improved scapular and shoulder strength, with improved mechanics with reaching, lifting, with decreased UT compensation. Pt has met goals and is ready for d/c to HEP. Pt in agreement with plan.    Examination-Activity Limitations Lift;Reach Overhead;Carry    Examination-Participation Restrictions Cleaning;Meal Prep;Community Activity;Occupation;Driving    Stability/Clinical Decision Making Stable/Uncomplicated    Rehab Potential Good    PT Frequency 2x / week    PT Duration 6 weeks    PT Treatment/Interventions ADLs/Self Care Home Management;Cryotherapy;Electrical Stimulation;Ultrasound;Traction;Moist Heat;Iontophoresis 26m/ml Dexamethasone;Functional mobility training;Therapeutic activities;Therapeutic  exercise;Patient/family education;Neuromuscular re-education;Manual techniques;Passive range of motion;Dry needling;Visual/perceptual remediation/compensation;Spinal Manipulations;Joint Manipulations;Taping    PT Home Exercise Plan [email or web address]    Consulted and Agree with Plan of Care Patient           Patient will benefit from skilled therapeutic intervention in order to improve the following deficits and impairments:  Pain,Improper body mechanics,Increased muscle spasms,Decreased activity tolerance,Decreased range of motion,Decreased strength  Visit Diagnosis: Cervicalgia     Problem List Patient Active Problem List   Diagnosis Date Noted  . Keratosis pilaris 04/11/2018  . Gastroesophageal reflux disease 03/29/2018  . Mild intermittent asthma 05/20/2017    LLyndee Hensen PT, DPT 8:46 AM  08/01/20    [facility] [place] [address]Phone: [phone]  Fax:  [phone] Name: Tonya KightMRN: [id number] of Birth: [birth year]  PHYSICAL THERAPY DISCHARGE SUMMARY  Visits from Start of Care: 8 Plan: Patient agrees to discharge.  Patient goals were met. Patient is being discharged due to meeting the stated rehab goals.  ?????     LLyndee Hensen PT, DPT 8:46 AM  08/01/20

## 2020-08-01 NOTE — Patient Instructions (Signed)
Access Code: [email or web address] URL: [email or web address] Date: 08/01/2020 Prepared by: Sedalia Muta  Exercises Seated Cervical Sidebending Stretch - 2 x daily - 3 reps - 30 hold Standing Backward Shoulder Rolls - 3 x daily - 1 sets - 10 reps Standing Cervical Retraction - 2 x daily - 1 sets - 10 reps Seated Scapular Retraction - 3 x daily - 1 sets - 10 reps Standing Row with Anchored Resistance - 1 x daily - 1 sets - 10 reps Standing Shoulder External Rotation with Resistance - 1 x daily - 1 sets - 10 reps Tricep Push Up on Wall - 1 x daily - 1-2 sets - 10 reps Supine Shoulder Horizontal Abduction with Resistance - 1 x daily - 1-2 sets - 10 reps Standing Shoulder Scaption - 1-2 x daily - 1 sets - 10 reps Shoulder Overhead Press in Abduction with Dumbbells - 1 x daily - 1 sets - 10 reps

## 2020-08-07 ENCOUNTER — Encounter: Payer: BC Managed Care – PPO | Admitting: Physical Therapy

## 2020-08-14 ENCOUNTER — Encounter: Payer: BC Managed Care – PPO | Admitting: Physical Therapy

## 2020-08-21 ENCOUNTER — Encounter: Payer: BC Managed Care – PPO | Admitting: Physical Therapy

## 2021-03-10 ENCOUNTER — Encounter: Payer: Self-pay | Admitting: Family Medicine

## 2021-03-10 ENCOUNTER — Ambulatory Visit: Payer: BC Managed Care – PPO | Admitting: Family Medicine

## 2021-03-10 ENCOUNTER — Other Ambulatory Visit: Payer: Self-pay

## 2021-03-10 VITALS — BP 118/82 | HR 75 | Temp 98.3°F | Ht 62.0 in | Wt 104.8 lb

## 2021-03-10 DIAGNOSIS — K219 Gastro-esophageal reflux disease without esophagitis: Secondary | ICD-10-CM | POA: Diagnosis not present

## 2021-03-10 DIAGNOSIS — Z23 Encounter for immunization: Secondary | ICD-10-CM | POA: Diagnosis not present

## 2021-03-10 DIAGNOSIS — Z1231 Encounter for screening mammogram for malignant neoplasm of breast: Secondary | ICD-10-CM | POA: Diagnosis not present

## 2021-03-10 DIAGNOSIS — Z1211 Encounter for screening for malignant neoplasm of colon: Secondary | ICD-10-CM | POA: Diagnosis not present

## 2021-03-10 DIAGNOSIS — Z1212 Encounter for screening for malignant neoplasm of rectum: Secondary | ICD-10-CM

## 2021-03-10 MED ORDER — PANTOPRAZOLE SODIUM 20 MG PO TBEC: 20.0000 mg | DELAYED_RELEASE_TABLET | Freq: Every day | ORAL | 1 refills | Status: DC

## 2021-03-10 NOTE — Patient Instructions (Addendum)
Please follow up as scheduled for your next visit with me: 05/28/2021 for your physical.   I recommend the Cologuard test for your colon cancer screening that is due. I have ordered this test for you. [facility] will soon contact you to verify your insurance, address etc. They will then send you the kit; follow the instructions in the kit and return the kit to [facility]. They will run the test and send the results to me. I will then give you the results. If this test is negative, we recommend repeating a colon cancer screening test in 3 years. If it is positive, I will refer you to a Gastroenterologist so you can get set up for the recommended colonoscopy.  Thank you!   If you have any questions or concerns, please don't hesitate to send me a message via MyChart or call the office at [phone]. Thank you for visiting with [place] today! It's our pleasure caring for you.   I have ordered a mammogram and/or bone density for you as we discussed today: _0   Mammogram  _1   Bone Density  Please call the office checked below to schedule your appointment:  _2   [facility]      [place], [place]         _3   [facility]  [address] [place], [place]

## 2021-03-10 NOTE — Progress Notes (Signed)
Subjective  CC:  Chief Complaint  Patient presents with   Gastroesophageal Reflux    GI referral    HPI: Tonya Higgins is a 50 y.o. female who presents to the office today to address the problems listed above in the chief complaint. 50 year old female with history of GERD responded well to Protonix about 2 years ago.  She presents today with a 3-week history of left upper abdominal pain described as burning.  On and off, but worse with activity.  She denies chest pain or heaviness in the chest.  No associated shortness of breath, nausea vomiting or diaphoresis.  The burning pain is similar to her GERD in the past however the location is more to the left.  She has had to stop eating spicy foods, cannot tolerate alcohol due to increased pain.  She reports normal bowel movements.  She has not taken anything for the pain except for Advil.  A friend of hers was diagnosed with stomach cancer.  She worries.  She had a negative H. pylori antibody test in 2019.  She has not needed PPIs for the last 6 months. Overdue for complete physical.  Scheduled in January.  Due for colon cancer screening and mammogram.  She is average risk for colon cancer Assessment  1. Gastroesophageal reflux disease, unspecified whether esophagitis present   2. Encounter for screening mammogram for breast cancer   3. Encounter for colorectal cancer screening   4. Need for immunization against influenza      Plan  GERD/gastritis: Start Protonix 20 mg daily for the next 4 to 12 weeks.  If symptoms persist, then further work-up will be initiated.  Patient agrees with care plan.  Check H. pylori breath test today Schedule mammogram and ordered Cologuard.  Discussed colon cancer screening options.  Patient defers colonoscopy. Flu shot today  Follow up: Return for as scheduled, complete physical.  05/28/2021  Orders Placed This Encounter  Procedures   MM DIGITAL SCREENING BILATERAL   Flu Vaccine QUAD 26mo+IM (Fluarix,  Fluzone & Alfiuria Quad PF)   Cologuard   H. pylori breath test   Meds ordered this encounter  Medications   pantoprazole (PROTONIX) 20 MG tablet    Sig: Take 1 tablet (20 mg total) by mouth daily.    Dispense:  90 tablet    Refill:  1      I reviewed the patients updated PMH, FH, and SocHx.    Patient Active Problem List   Diagnosis Date Noted   Gastroesophageal reflux disease 03/29/2018   Mild intermittent asthma 05/20/2017   Current Meds  Medication Sig   Multiple Vitamins-Minerals (WOMENS ONE DAILY) TABS Take 1 tablet by mouth daily.   pantoprazole (PROTONIX) 20 MG tablet Take 1 tablet (20 mg total) by mouth daily.    Allergies: Patient has No Known Allergies. Family History: Patient family history includes Asthma in her child; Diabetes in her father. Social History:  Patient  reports that she has never smoked. She has never used smokeless tobacco. She reports current alcohol use. She reports that she does not use drugs.  Review of Systems: Constitutional: Negative for fever malaise or anorexia Cardiovascular: negative for chest pain Respiratory: negative for SOB or persistent cough Gastrointestinal: negative for abdominal pain  Objective  Vitals: BP 118/82   Pulse 75   Temp 98.3 F (36.8 C) (Temporal)   Ht 5\' 2"  (1.575 m)   Wt 104 lb 12.8 oz (47.5 kg)   LMP 02/15/2021 (Exact Date)  SpO2 100%   BMI 19.17 kg/m  General: no acute distress , A&Ox3 HEENT: PEERL, conjunctiva normal, neck is supple Cardiovascular:  RRR without murmur or gallop.  Respiratory:  Good breath sounds bilaterally, CTAB with normal respiratory effort Gastrointestinal: soft, flat abdomen, normal active bowel sounds, no palpable masses, no hepatosplenomegaly, no appreciated hernias, nontender Skin:  Warm, no rashes    Commons side effects, risks, benefits, and alternatives for medications and treatment plan prescribed today were discussed, and the patient expressed understanding of the  given instructions. Patient is instructed to call or message via MyChart if he/she has any questions or concerns regarding our treatment plan. No barriers to understanding were identified. We discussed Red Flag symptoms and signs in detail. Patient expressed understanding regarding what to do in case of urgent or emergency type symptoms.  Medication list was reconciled, printed and provided to the patient in AVS. Patient instructions and summary information was reviewed with the patient as documented in the AVS. This note was prepared with assistance of Dragon voice recognition software. Occasional wrong-word or sound-a-like substitutions may have occurred due to the inherent limitations of voice recognition software  This visit occurred during the SARS-CoV-2 public health emergency.  Safety protocols were in place, including screening questions prior to the visit, additional usage of staff PPE, and extensive cleaning of exam room while observing appropriate contact time as indicated for disinfecting solutions.

## 2021-03-11 LAB — H. PYLORI BREATH TEST: H. pylori Breath Test: NOT DETECTED

## 2021-04-09 LAB — COLOGUARD: COLOGUARD: NEGATIVE

## 2021-04-30 IMAGING — DX DG CERVICAL SPINE COMPLETE 4+V
5 series · 5 of 5 positions shown · non-contrast
Comparison: None.

CLINICAL DATA: Cervicalgia

EXAM:
CERVICAL SPINE - COMPLETE 4+ VIEW

[cervical spine lat]
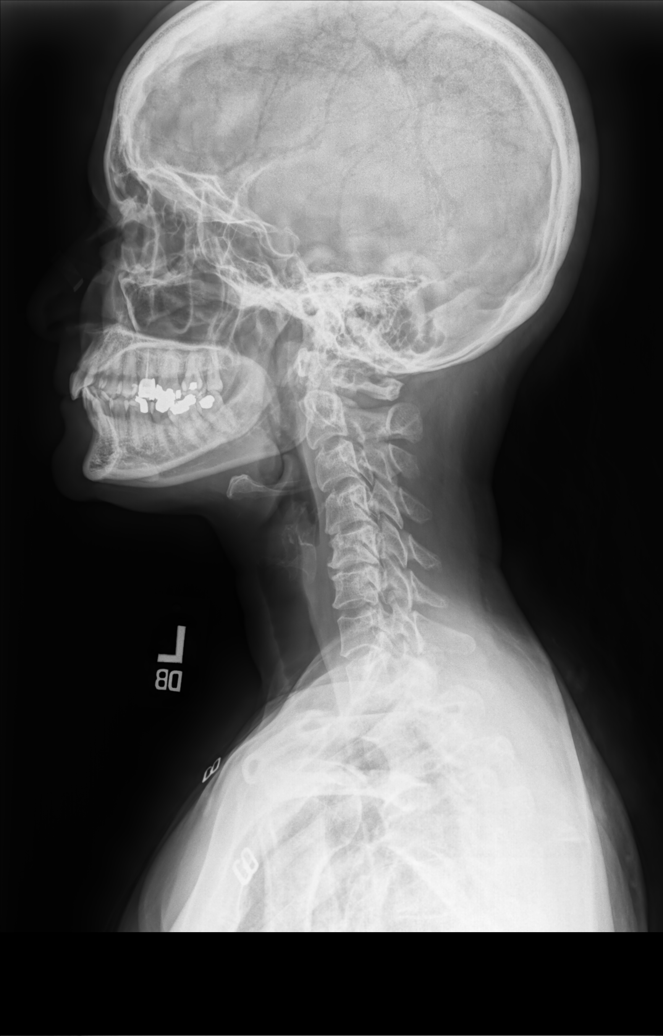

[cervical spine oblique (1 of 2)]
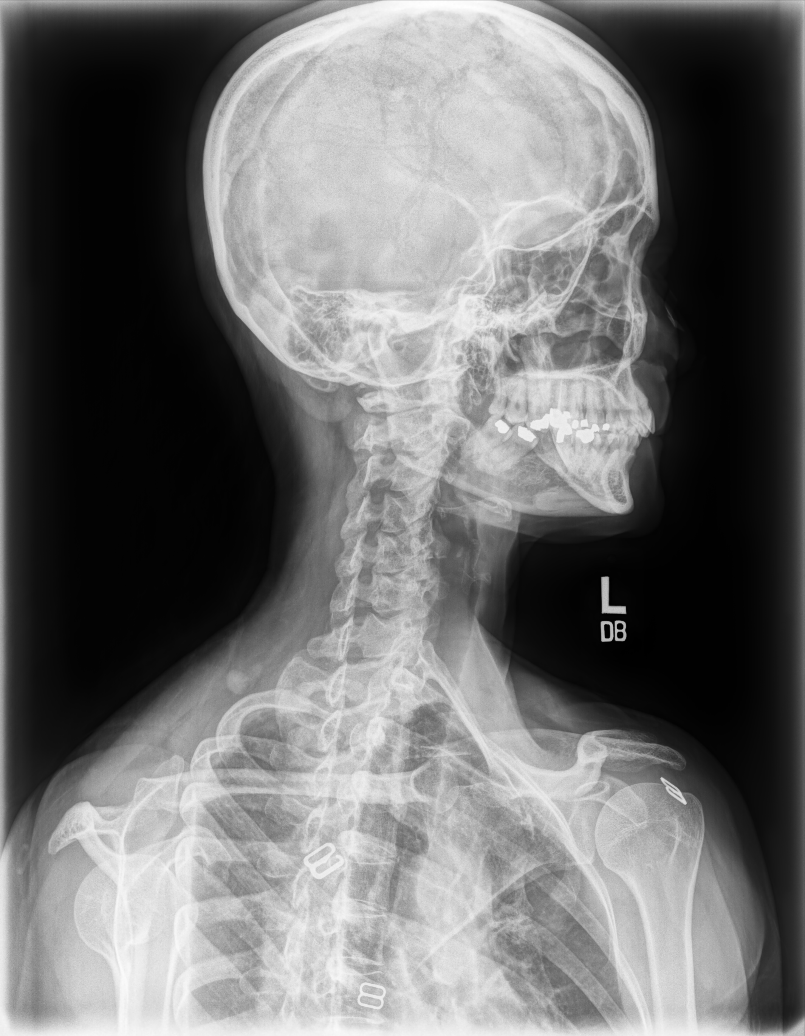

[cervical spine oblique (2 of 2)]
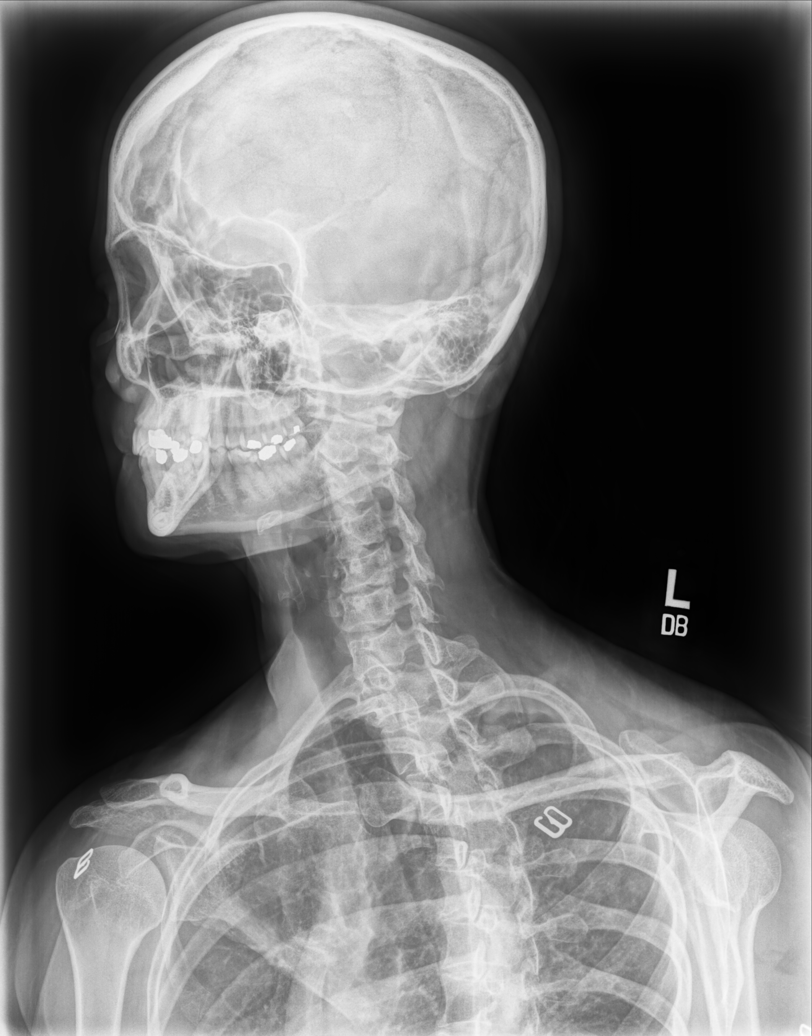

[cervical spine ap (1 of 2)]
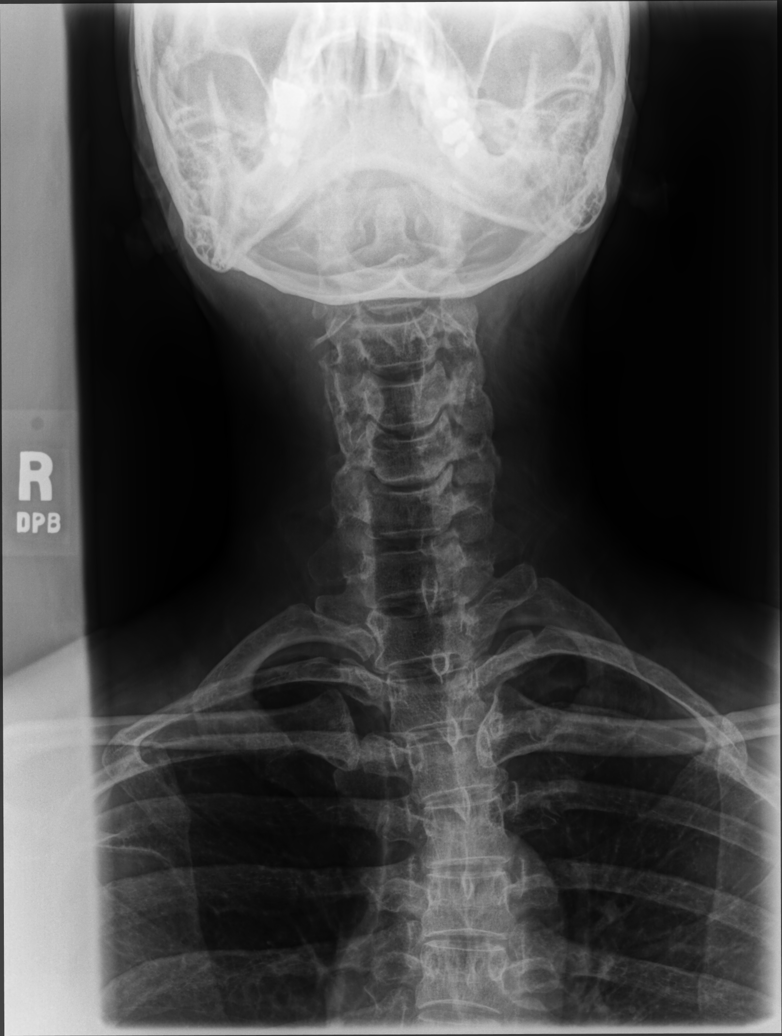

[cervical spine ap (2 of 2)]
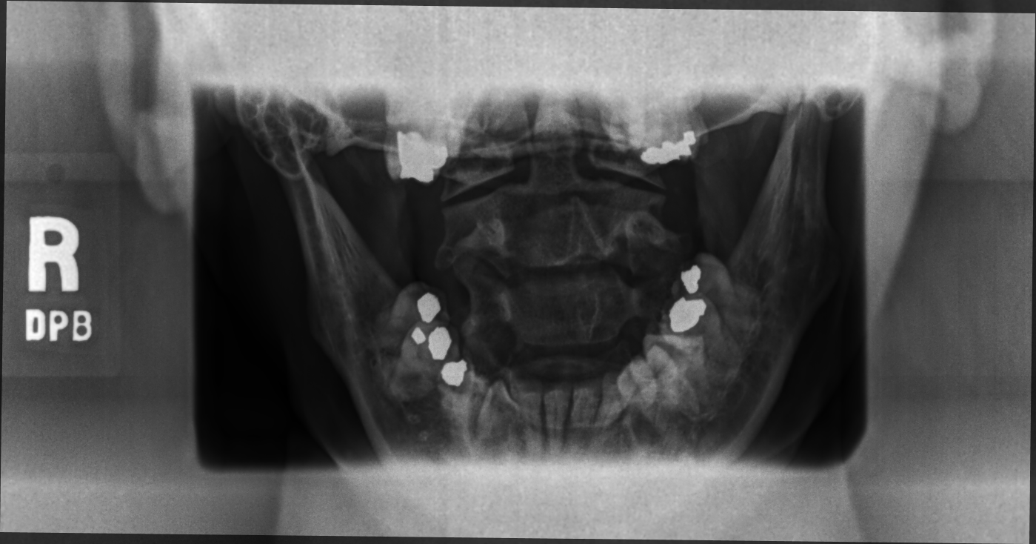

[5 of 5 positions shown; findings below may reference images not displayed]

FINDINGS: Frontal, lateral, open-mouth odontoid, and bilateral oblique views
were obtained. There is no fracture or spondylolisthesis.
Prevertebral soft tissues and predental space regions are normal.
There is moderate disc space narrowing at C5-6 with slightly milder
disc space narrowing at C4-5. Small anterior osteophytes noted at C5
and C6. There is facet hypertrophy with exit foraminal narrowing at
C4-5 and C5-6 bilaterally. No erosive changes. There is reversal of
lordotic curvature. Lung apices are clear. There is upper thoracic
levoscoliosis.
IMPRESSION: Osteoarthritic change, most severe at C4-5 and C5-6. No fracture or
spondylolisthesis.

Reversal of lordotic curvature is likely indicative of a degree of
muscle spasm.

There is upper thoracic levoscoliosis.

## 2021-05-13 ENCOUNTER — Ambulatory Visit
Admission: RE | Admit: 2021-05-13 | Discharge: 2021-05-13 | Disposition: A | Discharge status: Home / Self Care | Payer: BC Managed Care – PPO | Source: Ambulatory Visit | Attending: Family Medicine | Admitting: Family Medicine

## 2021-05-13 DIAGNOSIS — Z1231 Encounter for screening mammogram for malignant neoplasm of breast: Secondary | ICD-10-CM

## 2021-05-28 ENCOUNTER — Encounter: Payer: BC Managed Care – PPO | Admitting: Family Medicine

## 2022-02-02 ENCOUNTER — Encounter: Payer: Self-pay | Admitting: *Deleted

## 2022-04-23 ENCOUNTER — Encounter: Payer: Self-pay | Admitting: *Deleted

## 2022-07-15 ENCOUNTER — Ambulatory Visit: Payer: BC Managed Care – PPO | Admitting: Family Medicine

## 2022-07-15 ENCOUNTER — Encounter: Payer: Self-pay | Admitting: Family Medicine

## 2022-07-15 VITALS — BP 118/80 | HR 81 | Temp 98.2°F | Ht 62.0 in | Wt 111.6 lb

## 2022-07-15 DIAGNOSIS — B9689 Other specified bacterial agents as the cause of diseases classified elsewhere: Secondary | ICD-10-CM | POA: Diagnosis not present

## 2022-07-15 DIAGNOSIS — R051 Acute cough: Secondary | ICD-10-CM

## 2022-07-15 DIAGNOSIS — J208 Acute bronchitis due to other specified organisms: Secondary | ICD-10-CM | POA: Diagnosis not present

## 2022-07-15 MED ORDER — GUAIFENESIN-CODEINE 100-10 MG/5ML PO SOLN: 5.0000 mL | Freq: Four times a day (QID) | ORAL | 0 refills | Status: DC | PRN

## 2022-07-15 MED ORDER — AZITHROMYCIN 250 MG PO TABS: ORAL_TABLET | ORAL | 0 refills | Status: DC

## 2022-07-15 NOTE — Progress Notes (Signed)
Subjective  CC:  Chief Complaint  Patient presents with   Cough    Pt stated that she has been coughing for the past week. Pt tested for COVID yesterday and is was Neg.    HPI: SUBJECTIVE:  Tonya Higgins is a 52 y.o. female who complains of congestion, nasal blockage, post nasal drip, cough described as productive and denies sinus, high fevers, SOB, chest pain or significant GI symptoms. Symptoms have been present for 7-8 days. Not really improving. Can't sleep at night due to cough. Covid negative yesterday. No sick contacts. She denies a history of anorexia, dizziness, vomiting and wheezing. She denies a history of asthma or COPD. Patient does not smoke cigarettes.  Assessment  1. Acute bacterial bronchitis   2. Acute cough      Plan  Discussion:zpak and rob AC  Treat for bacterial bronchitis due to prolonged course and worsening symptoms. Education regarding differences between viral and bacterial infections and treatment options are discussed.  Supportive care measures are recommended.  We discussed the use of mucolytic's, decongestants, antihistamines and antitussives as needed.  Tylenol or Advil are recommended if needed.  Follow up: due for cpe   No orders of the defined types were placed in this encounter.  Meds ordered this encounter  Medications   azithromycin (ZITHROMAX) 250 MG tablet    Sig: Take 2 tabs today, then 1 tab daily for 4 days    Dispense:  1 each    Refill:  0   guaiFENesin-codeine 100-10 MG/5ML syrup    Sig: Take 5 mLs by mouth every 6 (six) hours as needed for cough.    Dispense:  120 mL    Refill:  0      I reviewed the patients updated PMH, FH, and SocHx.  Social History: Patient  reports that she has never smoked. She has never used smokeless tobacco. She reports current alcohol use. She reports that she does not use drugs.  Patient Active Problem List   Diagnosis Date Noted   Gastroesophageal reflux disease 03/29/2018   Mild  intermittent asthma 05/20/2017    Review of Systems: Cardiovascular: negative for chest pain Respiratory: negative for SOB or hemoptysis Gastrointestinal: negative for abdominal pain Genitourinary: negative for dysuria or gross hematuria Current Meds  Medication Sig   Multiple Vitamins-Minerals (WOMENS ONE DAILY) TABS Take 1 tablet by mouth daily.   azithromycin (ZITHROMAX) 250 MG tablet Take 2 tabs today, then 1 tab daily for 4 days   guaiFENesin-codeine 100-10 MG/5ML syrup Take 5 mLs by mouth every 6 (six) hours as needed for cough.    Objective  Vitals: BP 118/80   Pulse 81   Temp 98.2 F (36.8 C)   Ht '5\' 2"'$  (1.575 m)   Wt 111 lb 9.6 oz (50.6 kg)   SpO2 97%   BMI 20.41 kg/m  General: no acute distress  Psych:  Alert and oriented, normal mood and affect HEENT:  Normocephalic, atraumatic, supple neck, moist mucous membranes, mildly erythematous pharynx without exudate, mild lymphadenopathy, supple neck Cardiovascular:  RRR without murmur. no edema Respiratory:  Good breath sounds bilaterally, CTAB with normal respiratory effort with occasional rhonchi Skin:  Warm, no rashes Neurologic:   Mental status is normal.  Commons side effects, risks, benefits, and alternatives for medications and treatment plan prescribed today were discussed, and the patient expressed understanding of the given instructions. Patient is instructed to call or message via MyChart if he/she has any questions or concerns regarding our treatment  plan. No barriers to understanding were identified. We discussed Red Flag symptoms and signs in detail. Patient expressed understanding regarding what to do in case of urgent or emergency type symptoms.  Medication list was reconciled, printed and provided to the patient in AVS. Patient instructions and summary information was reviewed with the patient as documented in the AVS. This note was prepared with assistance of Dragon voice recognition software. Occasional  wrong-word or sound-a-like substitutions may have occurred due to the inherent limitations of voice recognition software

## 2022-07-15 NOTE — Patient Instructions (Addendum)
Please schedule an appointment for your complete physical. This should be done annually.  Your last was in 2021.   I have ordered a mammogram and/or bone density for you as we discussed today: '[x]'$   Mammogram  '[]'$   Bone Density  Please call the office checked below to schedule your appointment:  '[x]'$   [facility]      [address] [place], [place]         '[]'$   [facility]  [address] [place], [place]

## 2022-12-24 ENCOUNTER — Ambulatory Visit: Payer: BC Managed Care – PPO | Admitting: Internal Medicine

## 2022-12-24 ENCOUNTER — Encounter: Payer: Self-pay | Admitting: Internal Medicine

## 2022-12-24 VITALS — BP 100/58 | HR 98 | Temp 98.3°F | Ht 62.0 in | Wt 111.0 lb

## 2022-12-24 DIAGNOSIS — K2100 Gastro-esophageal reflux disease with esophagitis, without bleeding: Secondary | ICD-10-CM | POA: Diagnosis not present

## 2022-12-24 DIAGNOSIS — N951 Menopausal and female climacteric states: Secondary | ICD-10-CM

## 2022-12-24 DIAGNOSIS — R61 Generalized hyperhidrosis: Secondary | ICD-10-CM | POA: Diagnosis not present

## 2022-12-24 MED ORDER — PANTOPRAZOLE SODIUM 40 MG PO TBEC: 40.0000 mg | DELAYED_RELEASE_TABLET | Freq: Every day | ORAL | 0 refills | Status: AC

## 2022-12-24 MED ORDER — PEPCID COMPLETE 10-800-165 MG PO CHEW
1.0000 | CHEWABLE_TABLET | Freq: Two times a day (BID) | ORAL | 11 refills | Status: AC | PRN
Start: 2022-12-24 — End: ?

## 2022-12-24 MED ORDER — MAALOX MAX 400-400-40 MG/5ML PO SUSP
10.0000 mL | Freq: Four times a day (QID) | ORAL | 2 refills | Status: DC | PRN
Start: 2022-12-24 — End: 2023-06-22

## 2022-12-24 NOTE — Progress Notes (Signed)
[facility]: [phone]   Routine Medical Office Visit  Patient:  Tonya Higgins      Age: 52 y.o.       Sex:  female  Date:   12/24/2022 Patient Care Team: Willow Ora, MD as PCP - General (Family Medicine) Mansouraty, Netty Starring., MD as Consulting Physician (Gastroenterology) Nyoka Cowden, MD as Consulting Physician (Pulmonary Disease) Today's Healthcare Provider: Lula Olszewski, MD   Assessment and Plan:    Assessment & Plan Gastroesophageal reflux disease with esophagitis without hemorrhage She has been experiencing epigastric pain described as heartburn for about a month, with no relief from Tums and no recent changes in diet. There has been no vomiting or blood in vomit. We will start Pantoprazole for 24-hour relief until symptoms subside, which is expected in approximately 1-2 weeks. We recommend over-the-counter Pepcid Complete for as-needed relief after discontinuing Pantoprazole. A GI referral will be considered if symptoms persist after 1 month of Pantoprazole treatment.She will wait and call for gastrointestinal as needed (if doesn't resolve within a month of proton pump inhibitor (PPI) stomach acid reducer) Use proton pump inhibitor (PPI) stomach acid reducer about a week or 2, then taper to as needed Pepcid complete  Night sweats She report night sweats and insomnia likely due to menopause, with the last menstrual period in June and a preference to avoid medication after discussion of options. We will provide a list of herbal and natural remedies for her to consider. No pharmacological intervention will be pursued at this time.  Menopausal syndrome (hot flashes)     Orders          Ordered    pantoprazole (PROTONIX) 40 MG tablet  Daily        12/24/22 1206    famotidine-calcium carbonate-magnesium hydroxide (PEPCID COMPLETE) 10-800-165 MG chewable tablet  2 times daily PRN        12/24/22 1206    alum & mag hydroxide-simeth (MAALOX  MAX) 400-400-40 MG/5ML suspension  Every 6 hours PRN        12/24/22 1206            Recommended follow up: with Primary Care Provider (PCP) in 1-4 weeks if not improving  Future Appointments  Date Time Provider Department Center  02/10/2023  2:00 PM Willow Ora, MD [facility]           Clinical Presentation:    52 y.o. female who has Mild intermittent asthma and Gastroesophageal reflux disease on their problem list. Her reasons/main concerns/chief complaints for today's office visit are Heartburn, Insomnia, and Night Sweats (Also mornings )   AI-Extracted: Discussed the use of AI scribe software for clinical note transcription with the patient, who gave verbal consent to proceed.  History of Present Illness   The patient, with a history of heartburn diagnosed in 2019, presents with a recurrence of symptoms after a year of remission. She reports experiencing epigastric discomfort, akin to heartburn, for approximately a month. The discomfort is constant and has been particularly severe for the past week. The patient denies any specific dietary triggers, but notes an increase in frequency of meals due to a busy schedule over the past two weeks. She has been self-managing with over-the-counter Tums, which provides temporary relief for about 30 minutes. The patient has not been on any prescribed heartburn medication for the past year.  In addition to the heartburn, the patient reports experiencing night sweats and insomnia for the past month. She suspects these  symptoms may be related to menopause, as she is 52 years old and her last menstrual period was in June. The patient expresses a preference for managing these symptoms with herbal remedies and natural treatments, rather than prescription medications.     Epigastric pain about a month feels like, prior heartburn.  Can't eat sauce due to trigger. Might be related with eating constantly. Ran out of heartburn medication(s) a year ago.  Has been taking Tums helps 30 minutes.   She  has a past medical history of Asthma, GERD (gastroesophageal reflux disease), and Keratosis pilaris (04/11/2018).  Problem overviews that were updated today: Problem  Gastroesophageal Reflux Disease    Current Outpatient Medications on File Prior to Visit  Medication Sig   Multiple Vitamins-Minerals (WOMENS ONE DAILY) TABS Take 1 tablet by mouth daily.   No current facility-administered medications on file prior to visit.   Medications Discontinued During This Encounter  Medication Reason   azithromycin (ZITHROMAX) 250 MG tablet    guaiFENesin-codeine 100-10 MG/5ML syrup    predniSONE (DELTASONE) 10 MG tablet    pantoprazole (PROTONIX) 20 MG tablet          Clinical Data Analysis:   Physical Exam  BP (!) 100/58   Pulse 98   Temp 98.3 F (36.8 C) (Temporal)   Ht 5\' 2"  (1.575 m)   Wt 111 lb (50.3 kg)   LMP 10/24/2022   BMI 20.30 kg/m  Wt Readings from Last 10 Encounters:  12/24/22 111 lb (50.3 kg)  07/15/22 111 lb 9.6 oz (50.6 kg)  03/10/21 104 lb 12.8 oz (47.5 kg)  06/17/20 105 lb (47.6 kg)  05/30/20 103 lb 6.4 oz (46.9 kg)  04/26/20 104 lb (47.2 kg)  11/09/19 102 lb (46.3 kg)  05/10/19 99 lb 9.6 oz (45.2 kg)  06/22/18 102 lb 6.4 oz (46.4 kg)  04/11/18 97 lb 9.6 oz (44.3 kg)   Vital signs reviewed.  Nursing notes reviewed. Weight trend reviewed. Weight stable, borderline blood pressure noted. Abnormalities and Problem-Specific physical exam findings:  epigastric pain/tenderness  General Appearance:  No acute distress appreciable.   Well-groomed, healthy-appearing female.  Well proportioned with no abnormal fat distribution.  Good muscle tone. Skin: Clear and well-hydrated. Pulmonary:  Normal work of breathing at rest, no respiratory distress apparent.    Musculoskeletal: All extremities are intact.  Neurological:  Awake, alert, oriented, and engaged.  No obvious focal neurological deficits or cognitive impairments.   Sensorium seems unclouded.   Speech is clear and coherent with logical content. Psychiatric:  Appropriate mood, pleasant and cooperative demeanor, thoughtful and engaged during the exam  Results Reviewed:     No results found for any visits on 12/24/22.  No visits with results within 1 Year(s) from this visit.  Latest known visit with results is:  Office Visit on 03/10/2021  Component Date Value   COLOGUARD 04/06/2021 Negative    H. pylori Breath Test 03/10/2021 NOT DETECTED    No image results found.   No results found.  MM 3D SCREEN BREAST BILATERAL  Result Date: 05/13/2021 CLINICAL DATA:  Screening. EXAM: DIGITAL SCREENING BILATERAL MAMMOGRAM WITH TOMOSYNTHESIS AND CAD TECHNIQUE: Bilateral screening digital craniocaudal and mediolateral oblique mammograms were obtained. Bilateral screening digital breast tomosynthesis was performed. The images were evaluated with computer-aided detection. COMPARISON:  Previous exam(s). ACR Breast Density Category c: The breast tissue is heterogeneously dense, which may obscure small masses. FINDINGS: There are no findings suspicious for malignancy. IMPRESSION: No mammographic evidence of malignancy. A result  letter of this screening mammogram will be mailed directly to the patient. RECOMMENDATION: Screening mammogram in one year. (Code:SM-B-01Y) BI-RADS CATEGORY  1: Negative. Electronically Signed   By: Frederico Hamman M.D.   On: 05/13/2021 10:47      This encounter employed real-time, collaborative documentation. The patient actively reviewed and updated their medical record on a shared screen, ensuring transparency and facilitating joint problem-solving for the problem list, overview, and plan. This approach promotes accurate, informed care. The treatment plan was discussed and reviewed in detail, including medication safety, potential side effects, and all patient questions. We confirmed understanding and comfort with the plan. Follow-up instructions  were established, including contacting the office for any concerns, returning if symptoms worsen, persist, or new symptoms develop, and precautions for potential emergency department visits. ----------------------------------------------------- Lula Olszewski, MD  12/24/2022 8:58 PM  [facility]:  [phone]

## 2022-12-24 NOTE — Assessment & Plan Note (Addendum)
She has been experiencing epigastric pain described as heartburn for about a month, with no relief from Tums and no recent changes in diet. There has been no vomiting or blood in vomit. We will start Pantoprazole for 24-hour relief until symptoms subside, which is expected in approximately 1-2 weeks. We recommend over-the-counter Pepcid Complete for as-needed relief after discontinuing Pantoprazole. A GI referral will be considered if symptoms persist after 1 month of Pantoprazole treatment.She will wait and call for gastrointestinal as needed (if doesn't resolve within a month of proton pump inhibitor (PPI) stomach acid reducer) Use proton pump inhibitor (PPI) stomach acid reducer about a week or 2, then taper to as needed Pepcid complete

## 2022-12-24 NOTE — Patient Instructions (Addendum)
Here's a comprehensive list of natural remedies and herbal options that may help with menopausal symptoms such as hot flashes, insomnia, and night sweats:  1. Lifestyle changes:    - Regular exercise    - Stress reduction techniques (meditation, yoga, deep breathing)    - Maintaining a healthy weight    - Avoiding trigger foods (spicy foods, caffeine, alcohol)    - Dressing in layers    - Keeping the bedroom cool at night    - Using breathable, moisture-wicking sleepwear  2. Dietary adjustments:    - Increasing intake of phytoestrogen-rich foods (soy products, flaxseeds, lentils)    - Consuming more fruits and vegetables    - Staying hydrated    - Reducing sugar and processed food intake  3. Herbal remedies:    - Black cohosh    - Red clover    - Evening primrose oil    - Dong quai    - Ginseng    - Maca root    - Valerian root (for sleep)    - Chasteberry (Vitex)    - St. John's Wort (for mood)  4. Supplements:    - Vitamin E    - Vitamin B complex    - Omega-3 fatty acids    - Magnesium    - Calcium with Vitamin D  5. Essential oils (for aromatherapy):    - Peppermint    - Lavender    - Clary sage    - Geranium    - Cypress  6. Acupuncture and acupressure  7. Mind-body techniques:    - Cognitive Behavioral Therapy (CBT)    - Mindfulness practices    - Hypnosis  8. Cooling techniques:    - Using a fan at night    - Applying cold compresses    - Keeping ice water nearby  9. Herbal teas:    - Sage tea    - Rooibos tea    - Chamomile tea (for sleep)    - Lemon balm tea  10. Natural progesterone creams (derived from wild yam)  It's important to note that while these options are natural, they may still have side effects or interact with other medications. It's advisable for the lady to consult with a healthcare provider before starting any new regimen, especially if she has any underlying health conditions or is taking other medications.   VISIT  SUMMARY:  During your visit, we discussed your recurring heartburn symptoms and your recent experiences with night sweats and insomnia, which you suspect may be related to menopause. We have developed a plan to manage these issues.  YOUR PLAN:  -HEARTBURN: Your heartburn is likely due to a condition called Gastroesophageal Reflux Disease (GERD), which is when stomach acid frequently flows back into the tube connecting your mouth and stomach, causing discomfort. We will start you on a medication called Pantoprazole for 24-hour relief until your symptoms subside, which should be in about 1-2 weeks. After that, we recommend over-the-counter Pepcid Complete for as-needed relief. If your symptoms persist after 1 month of Pantoprazole treatment, we may consider referring you to a gastrointestinal specialist.  -NIGHT SWEATS AND INSOMNIA: Your night sweats and insomnia are likely due to menopause, which is a natural biological process marking the end of menstrual cycles. As you prefer to avoid medication, we will provide a list of herbal and natural remedies for you to consider. We will not pursue any pharmacological intervention at this time.  INSTRUCTIONS:  Please start  taking Pantoprazole as prescribed for your heartburn. After your symptoms subside, you can switch to over-the-counter Pepcid Complete for as-needed relief. If your symptoms persist after 1 month, please let us know. For your night sweats and insomnia, please review the list of herbal and natural remedies we provided and consider trying some of them. If your symptoms persist or worsen, please let us know.  It was a pleasure seeing you today! Your health and satisfaction are our top priorities.   Glenetta Hew, MD  Next Steps:  [x]  Flexible Follow-Up: We recommend a follow up with your primary care, a specialist, or me within 1-2 weeks for ensuring your problem resolves. This allows for progress monitoring and treatment adjustments. [x]   Early Intervention: Schedule sooner appointment, call our on-call services, or go to emergency room if there is Increase in pain or discomfort New or worsening symptoms Sudden or severe changes in your health [x]  Lab & X-ray Appointments: complete or schedule to complete today, or call to schedule.  X-rays: [facility] (M-F, 8:30am-noon or 1pm-5pm).  Making the Most of Our Focused (20 minute) Follow Up Appointments:  [x]   Clearly state your top concerns at the beginning of the visit to focus our discussion [x]   If you anticipate you will need more time, please inform the front desk during scheduling - we can book multiple appointments in the same week. [x]   If you have transportation problems- use our convenient video appointments or ask about transportation support. [x]   We can get down to business faster if you use MyChart to update information before the visit and submit non-urgent questions before your visit. Thank you for taking the time to provide details through MyChart.  Let our nurse know and she can import this information into your encounter documents.  Arrival and Wait Times: [x]   Arriving on time ensures that everyone receives prompt attention. [x]   Early morning (8a) and afternoon (1p) appointments tend to have shortest wait times. [x]   Unfortunately, we cannot delay appointments for late arrivals or hold slots during phone calls.  Getting Answers and Following Up  [x]   Simple Questions & Concerns: For quick questions or basic follow-up after your visit, reach [place] at [phone] or MyChart messaging. [x]   Complex Concerns: If your concern is more complex, scheduling an appointment might be best. Discuss this with the staff to find the most suitable option. [x]   Lab & Imaging Results: We'll contact you directly if results are abnormal or you don't use MyChart. Most normal results will be on MyChart within 2-3 business days, with a review message from Dr.  Jon Billings. Haven't heard back in 2 weeks? Need results sooner? Contact us at [phone]. [x]   Referrals: Our referral coordinator will manage specialist referrals. The specialist's office should contact you within 2 weeks to schedule an appointment. Call us if you haven't heard from them after 2 weeks.  Staying Connected  [x]   MyChart: Activate your MyChart for the fastest way to access results and message [place]. See the last page of this paperwork for instructions on how to activate.  Bring to Your Next Appointment  [x]   Medications: Please bring all your medication bottles to your next appointment to ensure we have an accurate record of your prescriptions. [x]   Health Diaries: If you're monitoring any health conditions at home, keeping a diary of your readings can be very helpful for discussions at your next appointment.  Billing  [x]   X-ray & Lab Orders: These  are billed by separate companies. Contact the invoicing company directly for questions or concerns. [x]   Visit Charges: Discuss any billing inquiries with our administrative services team.  Your Satisfaction Matters  [x]   Share Your Experience: We strive for your satisfaction! If you have any complaints, or preferably compliments, please let Dr. Jon Billings know directly or contact our Practice Administrators, Edwena Felty or [employer], by asking at the front desk.   Reviewing Your Records  [x]   Review this early draft of your clinical encounter notes below and the final encounter summary tomorrow on MyChart after its been completed.   Gastroesophageal reflux disease with esophagitis without hemorrhage Assessment & Plan: She will wait and call for gastrointestinal as needed (if doesn't resolve within a month of proton pump inhibitor (PPI) stomach acid reducer) Use proton pump inhibitor (PPI) stomach acid reducer about a week or 2, then taper to as needed Pepcid complete   Orders: -     Pantoprazole Sodium; Take 1 tablet (40  mg total) by mouth daily.  Dispense: 30 tablet; Refill: 0 -     Pepcid Complete; Chew 1 tablet by mouth 2 (two) times daily as needed.  Dispense: 100 tablet; Refill: 11 -     Maalox Max; Take 10 mLs by mouth every 6 (six) hours as needed for indigestion.  Dispense: 355 mL; Refill: 2  Night sweats  Menopausal syndrome (hot flashes)

## 2023-01-08 ENCOUNTER — Encounter: Payer: Self-pay | Admitting: Internal Medicine

## 2023-01-12 ENCOUNTER — Other Ambulatory Visit: Payer: Self-pay

## 2023-01-12 DIAGNOSIS — K2100 Gastro-esophageal reflux disease with esophagitis, without bleeding: Secondary | ICD-10-CM

## 2023-02-10 ENCOUNTER — Encounter: Payer: BC Managed Care – PPO | Admitting: Family Medicine

## 2023-04-07 ENCOUNTER — Encounter: Payer: BC Managed Care – PPO | Admitting: Family Medicine

## 2023-05-10 ENCOUNTER — Encounter: Payer: Self-pay | Admitting: Gastroenterology

## 2023-05-13 ENCOUNTER — Other Ambulatory Visit: Payer: Self-pay | Admitting: Family Medicine

## 2023-05-13 DIAGNOSIS — Z1231 Encounter for screening mammogram for malignant neoplasm of breast: Secondary | ICD-10-CM

## 2023-05-17 ENCOUNTER — Ambulatory Visit: Payer: Self-pay

## 2023-06-04 ENCOUNTER — Encounter: Payer: Self-pay | Admitting: Family Medicine

## 2023-06-08 ENCOUNTER — Ambulatory Visit
Admission: RE | Admit: 2023-06-08 | Discharge: 2023-06-08 | Disposition: A | Discharge status: Home / Self Care | Payer: 59 | Source: Ambulatory Visit | Attending: Family Medicine | Admitting: Family Medicine

## 2023-06-08 DIAGNOSIS — Z1231 Encounter for screening mammogram for malignant neoplasm of breast: Secondary | ICD-10-CM

## 2023-06-14 ENCOUNTER — Encounter: Payer: Self-pay | Admitting: Family Medicine

## 2023-06-22 ENCOUNTER — Ambulatory Visit (INDEPENDENT_AMBULATORY_CARE_PROVIDER_SITE_OTHER): Payer: 59 | Admitting: Family Medicine

## 2023-06-22 VITALS — BP 105/70 | HR 67 | Temp 98.1°F | Ht 62.0 in | Wt 113.4 lb

## 2023-06-22 DIAGNOSIS — Z23 Encounter for immunization: Secondary | ICD-10-CM | POA: Diagnosis not present

## 2023-06-22 DIAGNOSIS — N951 Menopausal and female climacteric states: Secondary | ICD-10-CM | POA: Diagnosis not present

## 2023-06-22 DIAGNOSIS — K219 Gastro-esophageal reflux disease without esophagitis: Secondary | ICD-10-CM

## 2023-06-22 DIAGNOSIS — Z0001 Encounter for general adult medical examination with abnormal findings: Secondary | ICD-10-CM

## 2023-06-22 LAB — CBC WITH DIFFERENTIAL/PLATELET
Basophils Absolute: 0.1 10*3/uL (ref 0.0–0.1)
Basophils Relative: 1.1 % (ref 0.0–3.0)
Eosinophils Absolute: 0.1 10*3/uL (ref 0.0–0.7)
Eosinophils Relative: 2.4 % (ref 0.0–5.0)
HCT: 42.2 % (ref 36.0–46.0)
Hemoglobin: 13.5 g/dL (ref 12.0–15.0)
Lymphocytes Relative: 38.7 % (ref 12.0–46.0)
Lymphs Abs: 1.9 10*3/uL (ref 0.7–4.0)
MCHC: 31.9 g/dL (ref 30.0–36.0)
MCV: 83.7 fL (ref 78.0–100.0)
Monocytes Absolute: 0.5 10*3/uL (ref 0.1–1.0)
Monocytes Relative: 9.2 % (ref 3.0–12.0)
Neutro Abs: 2.4 10*3/uL (ref 1.4–7.7)
Neutrophils Relative %: 48.6 % (ref 43.0–77.0)
Platelets: 332 10*3/uL (ref 150.0–400.0)
RBC: 5.04 Mil/uL (ref 3.87–5.11)
RDW: 13.8 % (ref 11.5–15.5)
WBC: 4.9 10*3/uL (ref 4.0–10.5)

## 2023-06-22 LAB — COMPREHENSIVE METABOLIC PANEL
ALT: 42 U/L — ABNORMAL HIGH (ref 0–35)
AST: 23 U/L (ref 0–37)
Albumin: 4.4 g/dL (ref 3.5–5.2)
Alkaline Phosphatase: 81 U/L (ref 39–117)
BUN: 16 mg/dL (ref 6–23)
CO2: 28 meq/L (ref 19–32)
Calcium: 9.3 mg/dL (ref 8.4–10.5)
Chloride: 104 meq/L (ref 96–112)
Creatinine, Ser: 0.76 mg/dL (ref 0.40–1.20)
GFR: 89.94 mL/min (ref >60.00)
Glucose, Bld: 99 mg/dL (ref 70–99)
Potassium: 4.2 meq/L (ref 3.5–5.1)
Sodium: 138 meq/L (ref 135–145)
Total Bilirubin: 0.6 mg/dL (ref 0.2–1.2)
Total Protein: 7.2 g/dL (ref 6.0–8.3)

## 2023-06-22 LAB — LIPID PANEL
Cholesterol: 223 mg/dL — ABNORMAL HIGH (ref 0–200)
HDL: 39.7 mg/dL (ref >39.00)
LDL Cholesterol: 153 mg/dL — ABNORMAL HIGH (ref 0–99)
NonHDL: 182.93
Total CHOL/HDL Ratio: 6
Triglycerides: 149 mg/dL (ref 0.0–149.0)
VLDL: 29.8 mg/dL (ref 0.0–40.0)

## 2023-06-22 LAB — TSH: TSH: 2.05 u[IU]/mL (ref 0.35–5.50)

## 2023-06-22 NOTE — Progress Notes (Signed)
 Subjective  Chief Complaint  Patient presents with   Annual Exam    Pt here for Annual exam and is currently fasting    Gastroesophageal Reflux    HPI: Tonya Higgins is a 53 y.o. female who presents to [facility] [place] today for a Female Wellness Visit. She also has the concerns and/or needs as listed above in the chief complaint. These will be addressed in addition to the Health Maintenance Visit.   Wellness Visit: annual visit with health maintenance review and exam  HM: mammo just done and normal. Pap normal and due again next year. CRC screen (had neg cologuard 03/2021) will be due this year. Eligible for tdap, flu and shingrix. Eye exam current. Diet is fair. Doing well overall. Chronic disease f/u and/or acute problem visit: (deemed necessary to be done in addition to the wellness visit): Premenopausal symptoms: Menstrual cycles are now irregular.  She skips 1 to 2 months intermittently.  Flow is lightening.  Describes hot flashes, day at nighttime.  Some sleep disturbances.  No other significant symptoms.  Tolerating well.  Fortunately hot flashes are improving.  She will monitor. GERD: Control is much improved now using Pepcid and/or Protonix only intermittently.  No daily symptoms, reflux, wheezing, melena.  She is scheduled to see GI to review.  Assessment  1. Encounter for well adult exam with abnormal findings   2. Need for shingles vaccine   3. Gastroesophageal reflux disease, unspecified whether esophagitis present   4. Menopausal symptoms   5. Need for influenza vaccination      Plan  Female Wellness Visit: Age appropriate Health Maintenance and Prevention measures were discussed with patient. Included topics are cancer screening recommendations, ways to keep healthy (see AVS) including dietary and exercise recommendations, regular eye and dental care, use of seat belts, and avoidance of moderate alcohol use and tobacco use.  Screens are current,  will order Cologuard in the fall for colorectal cancer screening.  She is average risk BMI: discussed patient's BMI and encouraged positive lifestyle modifications to help get to or maintain a target BMI. HM needs and immunizations were addressed and ordered. See below for orders. See HM and immunization section for updates.  Vaccine counseling done today, Shingrix No. 1 and flu vaccination updated.  She will return for second Shingrix and we can update the Tdap at her next visit. Routine labs and screening tests ordered including cmp, cbc and lipids where appropriate. Discussed recommendations regarding Vit D and calcium supplementation (see AVS)  Chronic disease management visit and/or acute problem visit: Assessment and Plan  Gastroesophageal Reflux Disease (GERD) Intermittent symptoms managed with as-needed Protonix and Pepcid.  -Continue Protonix 20 mg and Pepcid 10 mg as needed.  Menopause Irregular menses and occasional hot flashes. Discussed the benefits and risks of hormone therapy. -Consider hormone therapy if symptoms become more frequent or severe. -She will return if symptoms worsen.  She would be a good HRT candidate.  Could also use other options like SSRI or gabapentin. -Check TSH  Colon Cancer Screening Last colon screening was 3 years ago. Discussed the need for repeat screening in November. -Order Cologuard in September 2025.  General Health Maintenance -Mammogram was normal. -Pap smear due next year. -Eye and dental exams are up to date. -Order basic blood work today. Follow up: 12 months for complete physical, 6 months for second Shingrix Orders Placed This Encounter  Procedures   Zoster Recombinant (Shingrix )   Flu vaccine trivalent PF,  6mos and older(Flulaval,Afluria,Fluarix,Fluzone)   Lipid panel   Comprehensive metabolic panel   CBC with Differential/Platelet   TSH   No orders of the defined types were placed in this encounter.     Body mass index  is 20.74 kg/m. Wt Readings from Last 3 Encounters:  06/22/23 113 lb 6.4 oz (51.4 kg)  12/24/22 111 lb (50.3 kg)  07/15/22 111 lb 9.6 oz (50.6 kg)     Patient Active Problem List   Diagnosis Date Noted Date Diagnosed   Gastroesophageal reflux disease 03/29/2018    Health Maintenance  Topic Date Due   DTaP/Tdap/Td (1 - Tdap) Never done   Zoster Vaccines- Shingrix (1 of 2) Never done   COVID-19 Vaccine (6 - 2024-25 season) 07/08/2023 (Originally 01/10/2023)   INFLUENZA VACCINE  08/09/2023 (Originally 12/10/2022)   Fecal DNA (Cologuard)  04/06/2024   MAMMOGRAM  06/07/2024   Cervical Cancer Screening (HPV/Pap Cotest)  11/08/2024   Hepatitis C Screening  Completed   HPV VACCINES  Aged Out   HIV Screening  Discontinued   Immunization History  Administered Date(s) Administered   Influenza,inj,Quad PF,6+ Mos 02/22/2018, 02/03/2019, 04/29/2020, 03/10/2021, 03/25/2022   Influenza-Unspecified 02/08/2017   Janssen (J&J) SARS-COV-2 Vaccination 07/20/2019   PFIZER(Purple Top)SARS-COV-2 Vaccination 03/10/2020, 10/27/2020   Pfizer Covid-19 Vaccine Bivalent Booster 69yrs & up 04/13/2021   Pfizer(Comirnaty)Fall Seasonal Vaccine 12 years and older 03/29/2022   We updated and reviewed the patient's past history in detail and it is documented below. Allergies: Patient has no known allergies. Past Medical History Patient  has a past medical history of GERD (gastroesophageal reflux disease), History of shortness of breath/ likely GERD related (2019), and Keratosis pilaris (04/11/2018). Past Surgical History Patient  has a past surgical history that includes Cesarean section. Family History: Patient family history includes Asthma in her child; Diabetes in her father. Social History:  Patient  reports that she has never smoked. She has never used smokeless tobacco. She reports current alcohol use. She reports that she does not use drugs.  Review of Systems: Constitutional: negative for fever or  malaise Ophthalmic: negative for photophobia, double vision or loss of vision Cardiovascular: negative for chest pain, dyspnea on exertion, or new LE swelling Respiratory: negative for SOB or persistent cough Gastrointestinal: negative for abdominal pain, change in bowel habits or melena Genitourinary: negative for dysuria or gross hematuria, no abnormal uterine bleeding or disharge Musculoskeletal: negative for new gait disturbance or muscular weakness Integumentary: negative for new or persistent rashes, no breast lumps Neurological: negative for TIA or stroke symptoms Psychiatric: negative for SI or delusions Allergic/Immunologic: negative for hives  Patient Care Team    Relationship Specialty Notifications Start End  Willow Ora, MD PCP - General Family Medicine  05/10/19   Mansouraty, Netty Starring., MD Consulting Physician Gastroenterology  04/06/18   Nyoka Cowden, MD Consulting Physician Pulmonary Disease  05/10/19     Objective  Vitals: BP 105/70   Pulse 67   Temp 98.1 F (36.7 C)   Ht 5\' 2"  (1.575 m)   Wt 113 lb 6.4 oz (51.4 kg)   SpO2 100%   BMI 20.74 kg/m  General:  Well developed, well nourished, no acute distress  Psych:  Alert and orientedx3,normal mood and affect HEENT:  Normocephalic, atraumatic, non-icteric sclera,  supple neck without adenopathy, mass or thyromegaly Cardiovascular:  Normal S1, S2, RRR without gallop, rub or murmur Respiratory:  Good breath sounds bilaterally, CTAB with normal respiratory effort Gastrointestinal: normal bowel sounds, soft, non-tender, no  noted masses. No HSM MSK: extremities without edema, joints without erythema or swelling Neurologic:    Mental status is normal.  Gross motor and sensory exams are normal.  No tremor  Commons side effects, risks, benefits, and alternatives for medications and treatment plan prescribed today were discussed, and the patient expressed understanding of the given instructions. Patient is  instructed to call or message via MyChart if he/she has any questions or concerns regarding our treatment plan. No barriers to understanding were identified. We discussed Red Flag symptoms and signs in detail. Patient expressed understanding regarding what to do in case of urgent or emergency type symptoms.  Medication list was reconciled, printed and provided to the patient in AVS. Patient instructions and summary information was reviewed with the patient as documented in the AVS. This note was prepared with assistance of Dragon voice recognition software. Occasional wrong-word or sound-a-like substitutions may have occurred due to the inherent limitations of voice recognition software

## 2023-06-22 NOTE — Patient Instructions (Addendum)
Please return in 12 months for your annual complete physical; please come fasting.   I will release your lab results to you on your MyChart account with further instructions. You may see the results before I do, but when I review them I will send you a message with my report or have my assistant call you if things need to be discussed. Please reply to my message with any questions. Thank you!   If you have any questions or concerns, please don't hesitate to send me a message via MyChart or call the office at [phone]. Thank you for visiting with [place] today! It's our pleasure caring for you.    VISIT SUMMARY:  Today, you came in for your annual physical exam. You mentioned that you are generally feeling well and have no current health concerns. We discussed your upcoming travel plans to [place] and the importance of getting vaccinated for flu and shingles. We also reviewed your gastrointestinal health, menopause symptoms, and general health maintenance.  YOUR PLAN:  -IMMUNIZATIONS: Given your upcoming travel and the current flu season, it is important to get vaccinated. We administered the flu vaccine and the first dose of the Shingrix vaccine today. Please schedule the second dose of Shingrix in 2-6 months.  -GASTROESOPHAGEAL REFLUX DISEASE (GERD): GERD is a condition where stomach acid frequently flows back into the tube connecting your mouth and stomach. Your symptoms are intermittent and managed with Protonix and Pepcid as needed. Continue using these medications as required.  -MENOPAUSE: Menopause is the time that marks the end of your menstrual cycles. You have irregular periods and occasional hot flashes. We discussed hormone therapy as a potential option if your symptoms become more frequent or severe.  -COLON CANCER SCREENING: Colon cancer screening helps detect early signs of colon cancer. Your last screening was 3 years ago, and we will order a Cologuard test in September 2025 for  your next screening. Please send me a message.  -GENERAL HEALTH MAINTENANCE: Your mammogram was normal, and your eye and dental exams are up to date. A Pap smear is due next year. We will also order basic blood work today to check your overall health.  INSTRUCTIONS:  Please schedule the second dose of the Shingrix vaccine in 2-6 months. Your next colon cancer screening with Cologuard is due in September 2025. A Pap smear is due next year. Continue with your current medications for GERD as needed, and consider hormone therapy if menopause symptoms worsen.

## 2023-06-30 ENCOUNTER — Encounter: Payer: Self-pay | Admitting: Family Medicine

## 2023-06-30 NOTE — Progress Notes (Signed)
 See mychart note The 10-year ASCVD risk score (Arnett DK, et al., 2019) is: 1.7%   Values used to calculate the score:     Age: 53 years     Sex: Female     Is Non-Hispanic African American: No     Diabetic: No     Tobacco smoker: No     Systolic Blood Pressure: 105 mmHg     Is BP treated: No     HDL Cholesterol: 39.7 mg/dL     Total Cholesterol: 223 mg/dL  Dear Tonya Higgins, Your lab results look good overall. Your cholesterol is borderline high and a liver test is just above normal. Monitor your diet and lower fat and cholesterol if needed. This should help these numbers improve.  Sincerely, Dr. Mardelle Matte

## 2023-07-27 ENCOUNTER — Encounter: Payer: Self-pay | Admitting: Gastroenterology

## 2023-07-27 ENCOUNTER — Ambulatory Visit: Payer: BC Managed Care – PPO | Admitting: Gastroenterology

## 2023-07-27 VITALS — BP 100/70 | HR 68 | Ht 62.0 in | Wt 114.4 lb

## 2023-07-27 DIAGNOSIS — R140 Abdominal distension (gaseous): Secondary | ICD-10-CM | POA: Diagnosis not present

## 2023-07-27 DIAGNOSIS — Z1211 Encounter for screening for malignant neoplasm of colon: Secondary | ICD-10-CM | POA: Insufficient documentation

## 2023-07-27 DIAGNOSIS — K219 Gastro-esophageal reflux disease without esophagitis: Secondary | ICD-10-CM | POA: Diagnosis not present

## 2023-07-27 DIAGNOSIS — R12 Heartburn: Secondary | ICD-10-CM

## 2023-07-27 NOTE — Progress Notes (Unsigned)
 GASTROENTEROLOGY OUTPATIENT CLINIC VISIT   Primary Care Provider Willow Ora, MD [address] [place] [address] [phone]   Patient Profile: Tonya Higgins is a 53 y.o. female with pmh significant for hyperlipidemia, GERD.  The patient presents to the [facility] for an evaluation and management of problem(s) noted below:  Problem List 1. Pyrosis   2. Gastroesophageal reflux disease, unspecified whether esophagitis present   3. Bloating symptom   4. Colon cancer screening    Discussed the use of AI scribe software for clinical note transcription with the patient, who gave verbal consent to proceed.  History of Present Illness: Please see prior GI notes for full details of HPI.  Interval History The patient returns for follow-up (though I haven't seen her in over 4-years).  She has been experiencing issues of intermittent acid reflux sensation and bloating.  Last year, when she arranged for the clinic visit, she was experiencing pyrosis symptoms regularly.  She was taking PPI as well as H2RA with relatively good effectiveness.  Stress and workload can increase her symptoms at time.  She has made lifestyle modifications including decreasing soda and wine use which along with the medications helped with the frequency of symptoms.  She also rarely experiences occasional post-prandial bloating after consuming spicy foods.  She has not used any anti-gas medications like Gas-X or Beano.  She denies dysphagia to liquids or solids.  Her weight has been stable.  Family history is significant for diabetes but no gastrointestinal cancers.   GI Review of Systems Positive as above Negative for odynophagia, abdominal pain, nausea, vomiting, melena, hematochezia, change in bowel habits  Review of Systems General: Denies fevers/chills/weight loss unintentionally Cardiovascular: Denies chest pain Pulmonary: Denies shortness of breath Gastroenterological:  See HPI Genitourinary: Denies darkened urine Hematological: Denies easy bruising/bleeding Dermatological: Denies jaundice Psychological: Mood is stable   Medications Current Outpatient Medications  Medication Sig Dispense Refill   famotidine-calcium carbonate-magnesium hydroxide (PEPCID COMPLETE) [phone] MG chewable tablet Chew 1 tablet by mouth 2 (two) times daily as needed. (Patient taking differently: Chew 1 tablet by mouth as needed.) 100 tablet 11   Multiple Vitamins-Minerals (WOMENS ONE DAILY) TABS Take 1 tablet by mouth daily.     pantoprazole (PROTONIX) 40 MG tablet Take 1 tablet (40 mg total) by mouth daily. (Patient taking differently: Take 40 mg by mouth as needed.) 30 tablet 0   No current facility-administered medications for this visit.    Allergies No Known Allergies  Histories Past Medical History:  Diagnosis Date   GERD (gastroesophageal reflux disease)    History of shortness of breath/ likely GERD related 2019   Dr. Sherene Sires evaluated; possible MIA but never corroborated. no longer w/ sxs or meds   HLD (hyperlipidemia)    Keratosis pilaris 04/11/2018   Past Surgical History:  Procedure Laterality Date   CESAREAN SECTION     2006 and 2012   Social History   Socioeconomic History   Marital status: Married    Spouse name: Not on file   Number of children: 2   Years of education: Not on file   Highest education level: Doctorate  Occupational History   Occupation: professor    Employer: [employer]  Tobacco Use   Smoking status: Never   Smokeless tobacco: Never  Vaping Use   Vaping status: Never Used  Substance and Sexual Activity   Alcohol use: Yes    Comment: occassional wine   Drug use: No   Sexual activity:  Yes    Partners: Male    Birth control/protection: None  Other Topics Concern   Not on file  Social History Narrative   Married with two children.    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   Social Drivers of Research scientist (physical sciences) Strain: Low Risk  (06/22/2023)   Overall Financial Resource Strain (CARDIA)    Difficulty of Paying Living Expenses: Not hard at all  Food Insecurity: No Food Insecurity (06/22/2023)   Hunger Vital Sign    Worried About Running Out of Food in the Last Year: Never true    Ran Out of Food in the Last Year: Never true  Transportation Needs: No Transportation Needs (06/22/2023)   PRAPARE - Administrator, Civil Service (Medical): No    Lack of Transportation (Non-Medical): No  Physical Activity: Insufficiently Active (06/22/2023)   Exercise Vital Sign    Days of Exercise per Week: 3 days    Minutes of Exercise per Session: 30 min  Stress: No Stress Concern Present (06/22/2023)   [employer] of Occupational Health - Occupational Stress Questionnaire    Feeling of Stress : Not at all  Social Connections: Moderately Isolated (06/22/2023)   Social Connection and Isolation Panel [NHANES]    Frequency of Communication with Friends and Family: Once a week    Frequency of Social Gatherings with Friends and Family: More than three times a week    Attends Religious Services: Never    Database administrator or Organizations: No    Attends Engineer, structural: Not on file    Marital Status: Married  Intimate Partner Violence: Unknown (08/14/2021)   Received from [employer], [facility]   HITS    Physically Hurt: Not on file    Insult or Talk Down To: Not on file    Threaten Physical Harm: Not on file    Scream or Curse: Not on file   Family History  Problem Relation Age of Onset   Diabetes Father    Heart attack Father    Asthma Son    Breast cancer Neg Hx    Colon cancer Neg Hx    Esophageal cancer Neg Hx    Stomach cancer Neg Hx    Inflammatory bowel disease Neg Hx    Liver disease Neg Hx    Pancreatic cancer Neg Hx    Rectal cancer Neg Hx    I have reviewed her medical, social, and family history in detail and updated the electronic medical record  as necessary.    PHYSICAL EXAMINATION  BP 100/70 (BP Location: Left Arm, Patient Position: Sitting, Cuff Size: Normal)   Pulse 68   Ht 5\' 2"  (1.575 m) Comment: height measured without shoes  Wt 114 lb 6 oz (51.9 kg)   LMP 05/29/2023   BMI 20.92 kg/m  Wt Readings from Last 3 Encounters:  07/27/23 114 lb 6 oz (51.9 kg)  06/22/23 113 lb 6.4 oz (51.4 kg)  12/24/22 111 lb (50.3 kg)  GEN: NAD, appears stated age, doesn't appear chronically ill PSYCH: Cooperative, without pressured speech EYE: Conjunctivae pink, sclerae anicteric ENT: MMM CV: Nontachycardic RESP: No audible wheezing GI: NABS, soft, NT/ND, without rebound MSK/EXT: No lower extremity edema SKIN: No jaundice NEURO:  Alert & Oriented x 3, no focal deficits   REVIEW OF DATA  I reviewed the following data at the time of this encounter:  GI Procedures and Studies  No relevant studies to review  Laboratory  Studies  Reviewed in epic  Imaging Studies  No relevant studies to review   ASSESSMENT  Ms. Schlechter is a 53 y.o. female without a significant pmh other than a single episode of short standing GERD.  The patient is seen today for evaluation and management of:  1. Pyrosis   2. Gastroesophageal reflux disease, unspecified whether esophagitis present   3. Bloating symptom   4. Colon cancer screening    The patient is hemodynamically and clinically stable at this time.  Overall, seems to be doing well with lifestyle modifications.  Can continue to use H2RA PRN.  If she begins to use more frequently, then that is OK, but if daily use, then likely will need to restart PPI.  No red flag indication currently for EGD, but if this continues to be an issue we can certainly purse consideration of EGD.  Will plan H. Pylori testing to ensure this is not playing role with her post-prandial bloating.  If this continues to be an issue, she will use Gas-X or Phazyme.  Discussed Low-FODMAP diet modifications and she will look into  her foods to see if she is ingesting higher FODMAP foods more regularly to try and limit those.  Holding on SIBO and EPI evaluation.  She will need repeat Cologuard testing this year from last negative in 2022, which we will order for recall.  All patient questions were answered to the best of my ability, and the patient agrees to the aforementioned plan of action with follow-up as indicated.   PLAN  May continue daily to twice daily H2RA use as needed -If daily use is occurring then will likely need to consider longer-term PPI continued usage -At that point would recommend EGD for consideration and rule out of esophagitis H. Pylori Diatherix testing to be performed Cologuard recall in 11/25   Orders Placed This Encounter  Procedures   Other/Misc lab test    New Prescriptions   No medications on file   Modified Medications   No medications on file    Planned Follow Up: No follow-ups on file.   Total Time in Face-to-Face and in Coordination of Care for patient including independent/personal interpretation/review of prior testing, medical history, examination, medication adjustment, communicating results with the patient directly, and documentation within the EHR is 30 minutes.   Corliss Parish, MD [facility] Office # [phone]

## 2023-07-27 NOTE — Patient Instructions (Addendum)
 Please purchase the following medications over the counter and take as directed: Pepcid -Take 1-2 times daily as needed.   If bloating persist we will consider additional work-up.   You will be due for a recall cologuard in 03/30/24. We will send you a reminder in the mail when it gets closer to that time.  Please see Low-Fod Map handout.   Your provider has ordered "Diatherix" stool testing for you. You have received a kit from our office today containing all necessary supplies to complete this test. Please carefully read the stool collection instructions provided in the kit before opening the accompanying materials. In addition, be sure there is a label providing your full name and date of birth on the "puritan opti-swab" tube that is supplied in the kit (if you do not see a label with this information on your test tube, please make [place] aware before test collection!). After completing the test, you should secure the purtian tube into the specimen biohazard bag. [facility] E-Req sheet (including date and time of specimen collection) should be placed into the outside pocket of the specimen biohazard bag and returned to the [place] lab (basement floor of [facility]) within 3 days of collection. Please make sure to give the specimen to a staff member at the lab. DO NOT leave the specimen on the counter.   If the specimen date and time (can be found in the upper right boxed portion of the sheet) are not filled out on the E-Req sheet, the test will NOT be performed.   _______________________________________________________  If your blood pressure at your visit was 140/90 or greater, please contact your primary care physician to follow up on this.  _______________________________________________________  If you are age 68 or older, your body mass index should be between 23-30. Your Body mass index is 20.92 kg/m. If this is out of the aforementioned range listed,  please consider follow up with your Primary Care Provider.  If you are age 68 or younger, your body mass index should be between 19-25. Your Body mass index is 20.92 kg/m. If this is out of the aformentioned range listed, please consider follow up with your Primary Care Provider.   ________________________________________________________  The [facility] providers would like to encourage you to use [facility] to communicate with providers for non-urgent requests or questions.  Due to long hold times on the telephone, sending your provider a message by [facility] may be a faster and more efficient way to get a response.  Please allow 48 business hours for a response.  Please remember that this is for non-urgent requests.  _______________________________________________________  Thank you for choosing me and [facility].  Dr. Meridee Score

## 2023-11-04 ENCOUNTER — Ambulatory Visit (INDEPENDENT_AMBULATORY_CARE_PROVIDER_SITE_OTHER): Admitting: Family

## 2023-11-04 ENCOUNTER — Encounter: Payer: Self-pay | Admitting: Family

## 2023-11-04 VITALS — BP 97/59 | HR 76 | Temp 98.4°F | Ht 62.0 in | Wt 112.0 lb

## 2023-11-04 DIAGNOSIS — L299 Pruritus, unspecified: Secondary | ICD-10-CM

## 2023-11-04 DIAGNOSIS — R21 Rash and other nonspecific skin eruption: Secondary | ICD-10-CM

## 2023-11-04 MED ORDER — TRIAMCINOLONE ACETONIDE 0.1 % EX CREA: 1.0000 | TOPICAL_CREAM | Freq: Two times a day (BID) | CUTANEOUS | 0 refills | Status: AC

## 2023-11-04 MED ORDER — PREDNISONE 10 MG PO TABS: ORAL_TABLET | ORAL | 0 refills | Status: AC

## 2023-11-04 MED ORDER — HYDROXYZINE HCL 10 MG PO TABS: 10.0000 mg | ORAL_TABLET | Freq: Three times a day (TID) | ORAL | 0 refills | Status: AC | PRN

## 2023-11-04 NOTE — Progress Notes (Signed)
   Patient ID: Tonya Higgins, female    DOB: [birth year], 53 y.o.   MRN: [id number]  Chief Complaint  Patient presents with   Rash    Both legs and under abdominal area; x3 days; redness and itching really bad; haven't been outside, no new pets in the house, no one in the home has traveled anywhere; no food allergies; only thing new was husband and son was painting the deck; works in [employer] lab but nothing new or done differently  Discussed the use of AI scribe software for clinical note transcription with the patient, who gave verbal consent to proceed.  History of Present Illness Tonya Higgins is a 53 year old female who presents with a widespread itchy rash.  She has a widespread itchy rash with red, raised bumps that began a few days ago, affecting multiple areas of her body. The rash has persisted for four days and worsened slightly over the last two days. Antihistamines like Benadryl and Allegra have alleviated the itchiness but not resolved the rash. She applies lanolin for skin hydration. There is no exposure to new medications, foods, or personal care products. She uses non-perfumed products and has not changed her laundry detergent. She has not been in areas with ticks and regularly sprays her yard.  Assessment & Plan Generalized Pruritic Rash Acute pruritic rash with no clear allergen exposure. Partial relief with antihistamines. No systemic symptoms. Prednisone  chosen over injection due to preference and potential sleep disturbances. - Administer prednisone  taper over 5 days. Discussed possible side effects: irritability, hunger, sleep disturbances. Encourage hydration. - Prescribe hydroxyzine 10mg  tid for pruritus. Caution for drowsiness and xerostomia. Advise trial at home before driving. - Prescribe topical steroid, triamcinolone  0.1% bid for more pruritic areas. - Recommend generic Aveeno oatmeal bath for temporary relief. - Continue Allegra for antihistamine effect. -  Call office if sx persist after finishing prednisone .   Subjective:    Outpatient Medications Prior to Visit  Medication Sig Dispense Refill   famotidine -calcium carbonate-magnesium hydroxide (PEPCID  COMPLETE) [phone] MG chewable tablet Chew 1 tablet by mouth 2 (two) times daily as needed. 100 tablet 11   Multiple Vitamins-Minerals (WOMENS ONE DAILY) TABS Take 1 tablet by mouth daily.     pantoprazole  (PROTONIX ) 40 MG tablet Take 1 tablet (40 mg total) by mouth daily. (Patient not taking: Reported on 11/04/2023) 30 tablet 0   No facility-administered medications prior to visit.   Past Medical History:  Diagnosis Date   GERD (gastroesophageal reflux disease)    History of shortness of breath/ likely GERD related 2019   Dr. Darlean evaluated; possible MIA but never corroborated. no longer w/ sxs or meds   HLD (hyperlipidemia)    Keratosis pilaris 04/11/2018   Past Surgical History:  Procedure Laterality Date   CESAREAN SECTION     2006 and 2012   No Known Allergies    Objective:    Physical Exam  Skin:    Findings: Rash (red, small bumps on bilateral shoulders, lower abdomen, legs, no diffuse erythema noted) present.    BP (!) 97/59   Pulse 76   Temp 98.4 F (36.9 C)   Ht 5' 2 (1.575 m)   Wt 112 lb (50.8 kg)   SpO2 98%   BMI 20.49 kg/m  Wt Readings from Last 3 Encounters:  11/04/23 112 lb (50.8 kg)  07/27/23 114 lb 6 oz (51.9 kg)  06/22/23 113 lb 6.4 oz (51.4 kg)      Lucius Krabbe, NP

## 2024-05-17 ENCOUNTER — Other Ambulatory Visit: Payer: Self-pay | Admitting: Family Medicine

## 2024-05-17 DIAGNOSIS — Z1231 Encounter for screening mammogram for malignant neoplasm of breast: Secondary | ICD-10-CM

## 2024-06-08 ENCOUNTER — Ambulatory Visit
Admission: RE | Admit: 2024-06-08 | Discharge: 2024-06-08 | Disposition: A | Discharge status: Home / Self Care | Source: Ambulatory Visit | Attending: Family Medicine | Admitting: Family Medicine

## 2024-06-08 DIAGNOSIS — Z1231 Encounter for screening mammogram for malignant neoplasm of breast: Secondary | ICD-10-CM
